# Patient Record
Sex: Female | Born: 1950 | Race: Black or African American | Hispanic: No | Marital: Single | Smoking: Former smoker
Health system: Southern US, Community
[De-identification: ages and names within clinical notes are randomized; demographics above are authoritative.]

## PROBLEM LIST (undated history)

## (undated) DIAGNOSIS — IMO0001 Reserved for inherently not codable concepts without codable children: Secondary | ICD-10-CM

## (undated) DIAGNOSIS — G4730 Sleep apnea, unspecified: Secondary | ICD-10-CM

## (undated) DIAGNOSIS — C9590 Leukemia, unspecified not having achieved remission: Secondary | ICD-10-CM

## (undated) DIAGNOSIS — Z9889 Other specified postprocedural states: Secondary | ICD-10-CM

## (undated) DIAGNOSIS — T4145XA Adverse effect of unspecified anesthetic, initial encounter: Secondary | ICD-10-CM

## (undated) DIAGNOSIS — A809 Acute poliomyelitis, unspecified: Secondary | ICD-10-CM

## (undated) DIAGNOSIS — M792 Neuralgia and neuritis, unspecified: Secondary | ICD-10-CM

## (undated) DIAGNOSIS — R51 Headache: Secondary | ICD-10-CM

## (undated) DIAGNOSIS — Z972 Presence of dental prosthetic device (complete) (partial): Secondary | ICD-10-CM

## (undated) DIAGNOSIS — Z973 Presence of spectacles and contact lenses: Secondary | ICD-10-CM

## (undated) DIAGNOSIS — I2699 Other pulmonary embolism without acute cor pulmonale: Secondary | ICD-10-CM

## (undated) DIAGNOSIS — T8859XA Other complications of anesthesia, initial encounter: Secondary | ICD-10-CM

## (undated) DIAGNOSIS — Z9289 Personal history of other medical treatment: Secondary | ICD-10-CM

## (undated) DIAGNOSIS — R519 Headache, unspecified: Secondary | ICD-10-CM

## (undated) DIAGNOSIS — R32 Unspecified urinary incontinence: Secondary | ICD-10-CM

## (undated) DIAGNOSIS — R112 Nausea with vomiting, unspecified: Secondary | ICD-10-CM

## (undated) DIAGNOSIS — Z86718 Personal history of other venous thrombosis and embolism: Secondary | ICD-10-CM

## (undated) DIAGNOSIS — N3281 Overactive bladder: Secondary | ICD-10-CM

## (undated) DIAGNOSIS — M1990 Unspecified osteoarthritis, unspecified site: Secondary | ICD-10-CM

## (undated) HISTORY — PX: HIP ARTHROSCOPY: SUR88

## (undated) HISTORY — PX: TUMOR REMOVAL: SHX12

## (undated) HISTORY — PX: BACK SURGERY: SHX140

## (undated) HISTORY — PX: ABDOMINAL HYSTERECTOMY: SHX81

## (undated) HISTORY — PX: KNEE ARTHROSCOPY: SUR90

## (undated) HISTORY — PX: COLONOSCOPY W/ POLYPECTOMY: SHX1380

---

## 1982-06-10 DIAGNOSIS — R112 Nausea with vomiting, unspecified: Secondary | ICD-10-CM

## 1982-06-10 DIAGNOSIS — Z9889 Other specified postprocedural states: Secondary | ICD-10-CM

## 1982-06-10 HISTORY — PX: VAGINAL HYSTERECTOMY: SUR661

## 1982-06-10 HISTORY — DX: Other specified postprocedural states: R11.2

## 1982-06-10 HISTORY — DX: Other specified postprocedural states: Z98.890

## 2011-10-05 ENCOUNTER — Emergency Department (INDEPENDENT_AMBULATORY_CARE_PROVIDER_SITE_OTHER): Payer: Worker's Compensation

## 2011-10-05 ENCOUNTER — Encounter (HOSPITAL_BASED_OUTPATIENT_CLINIC_OR_DEPARTMENT_OTHER): Payer: Self-pay | Admitting: *Deleted

## 2011-10-05 ENCOUNTER — Emergency Department (HOSPITAL_BASED_OUTPATIENT_CLINIC_OR_DEPARTMENT_OTHER)
Admission: EM | Admit: 2011-10-05 | Discharge: 2011-10-05 | Disposition: A | Discharge status: Home / Self Care | Payer: Worker's Compensation | Attending: Emergency Medicine | Admitting: Emergency Medicine

## 2011-10-05 DIAGNOSIS — W010XXA Fall on same level from slipping, tripping and stumbling without subsequent striking against object, initial encounter: Secondary | ICD-10-CM | POA: Insufficient documentation

## 2011-10-05 DIAGNOSIS — W19XXXA Unspecified fall, initial encounter: Secondary | ICD-10-CM

## 2011-10-05 DIAGNOSIS — M25569 Pain in unspecified knee: Secondary | ICD-10-CM | POA: Insufficient documentation

## 2011-10-05 DIAGNOSIS — F17200 Nicotine dependence, unspecified, uncomplicated: Secondary | ICD-10-CM | POA: Insufficient documentation

## 2011-10-05 DIAGNOSIS — M25469 Effusion, unspecified knee: Secondary | ICD-10-CM

## 2011-10-05 DIAGNOSIS — M129 Arthropathy, unspecified: Secondary | ICD-10-CM | POA: Insufficient documentation

## 2011-10-05 HISTORY — DX: Unspecified osteoarthritis, unspecified site: M19.90

## 2011-10-05 MED ORDER — HYDROCODONE-ACETAMINOPHEN 5-325 MG PO TABS
1.0000 | ORAL_TABLET | Freq: Once | ORAL | Status: AC
  Administered 2011-10-05: 1 via ORAL
  Filled 2011-10-05: qty 1

## 2011-10-05 MED ORDER — HYDROCODONE-ACETAMINOPHEN 5-500 MG PO TABS: 1.0000 | ORAL_TABLET | Freq: Four times a day (QID) | ORAL | Status: AC | PRN

## 2011-10-05 NOTE — ED Provider Notes (Signed)
History     CSN: [id number]  Arrival date & time 10/05/11  1814   First MD Initiated Contact with Patient 10/05/11 1831      Chief Complaint  Patient presents with  . Knee Pain    (Consider location/radiation/quality/duration/timing/severity/associated sxs/prior treatment) HPI Comments: Pt states that she tripped and fell yesterday and both of her knees hit the tile floor:pt states that initially the right knee was hurting but that has resolved:pt states that she is not only having pain in her left knee which is not resolving with ibuprofen:pt has history of arthroscopic surgery to both  Patient is a 61 y.o. female presenting with knee pain. The history is provided by the patient. No language interpreter was used.  Knee Pain This is a new problem. The current episode started yesterday. The problem occurs constantly. The problem has been unchanged. The symptoms are aggravated by walking and bending. She has tried NSAIDs for the symptoms. The treatment provided mild relief.    Past Medical History  Diagnosis Date  . Arthritis     Past Surgical History  Procedure Date  . Knee surgery   . Abdominal hysterectomy   . Back surgery     History reviewed. No pertinent family history.  History  Substance Use Topics  . Smoking status: Current Some Day Smoker  . Smokeless tobacco: Not on file  . Alcohol Use: No    OB History    Grav Para Term Preterm Abortions TAB SAB Ect Mult Living                  Review of Systems  Constitutional: Negative.   Respiratory: Negative.   Cardiovascular: Negative.   Genitourinary: Negative.   Neurological: Negative.     Allergies  Review of patient's allergies indicates not on file.  Home Medications  No current outpatient prescriptions on file.  BP 164/90  Pulse 90  Temp(Src) 98.4 F (36.9 C) (Oral)  Resp 20  Ht 5' 5.5" (1.664 m)  Wt 345 lb (156.491 kg)  BMI 56.54 kg/m2  SpO2 98%  Physical Exam  Nursing note and vitals  reviewed. Constitutional: She is oriented to person, place, and time. She appears well-developed and well-nourished.  Cardiovascular: Normal rate and regular rhythm.   Pulmonary/Chest: Effort normal and breath sounds normal.  Musculoskeletal:       Pt has generalized tenderness noted to the left knee:pt has full rom:no redness noted  Neurological: She is alert and oriented to person, place, and time.  Skin: Skin is warm.  Psychiatric: She has a normal mood and affect.    ED Course  Procedures (including critical care time)  Labs Reviewed - No data to display Dg Knee Complete 4 Views Left  10/05/2011  *RADIOLOGY REPORT*  Clinical Data: Larey Seat.  Pain.  LEFT KNEE - COMPLETE 4+ VIEW  Comparison: None.  Findings: There is a moderate sized joint effusion.  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  No identifiable acute fracture.  I think there is a small intra-articular loose body.  IMPRESSION: Moderate joint effusion.  Tricompartmental degenerative change most pronounced laterally.  Probable small intra-articular loose body. No acute fracture evident.  Original Report Authenticated By: Thomasenia Sales, M.D.     1. Knee effusion       MDM  Pt placed in knee immobilizer:pt has a walker at home        Teressa Lower, NP 10/05/11 1947

## 2011-10-05 NOTE — ED Provider Notes (Signed)
Medical screening examination/treatment/procedure(s) were performed by non-physician practitioner and as supervising physician I was immediately available for consultation/collaboration.   Celene Kras, MD 10/05/11 [phone]

## 2011-10-05 NOTE — ED Notes (Signed)
Pt stopped at registration upon discharge from facility inquiring about need for UDS.  I went and spoke with patient re: UDS and explained that we would not know which employers require UDS with workers comp cases and that would be up to the individual and the employer to alert ED staff of need for UDS.  Offered to obtain UDS for pt in case she found at later date that claim would require one.  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

## 2011-10-05 NOTE — ED Notes (Signed)
Pt states she slipped yesterday and fell on both knees. Now c/o pain to both. Left worse than right. Some relief with Motrin.

## 2011-10-05 NOTE — Discharge Instructions (Signed)
Knee Effusion  Knee effusion means you have fluid in your knee. The knee may be more difficult to bend and move. HOME CARE  Use crutches or a brace as told by your doctor.   Put ice on the injured area.   Put ice in a plastic bag.   Place a towel between your skin and the bag.   Leave the ice on for 15 to 20 minutes, 3 to 4 times a day.   Raise (elevate) your knee as much as possible.   Only take medicine as told by your doctor.   You may need to do strengthening exercises. Ask your doctor.   Continue with your normal diet and activities as told by your doctor.  GET HELP RIGHT AWAY IF:  You have more puffiness (swelling) in your knee.   You see redness, puffiness, or have more pain in your knee.   You have a temperature by mouth above 102 F (38.9 C).   You get a rash.   You have trouble breathing.   You have a reaction to any medicine you are taking.   You have a lot of pain when you move your knee.  MAKE SURE YOU:  Understand these instructions.   Will watch your condition.   Will get help right away if you are not doing well or get worse.  Document Released: 06/29/2010 Document Revised: 05/16/2011 Document Reviewed: 06/29/2010 ExitCare Patient Information 2012 [employer]. 

## 2012-06-10 DIAGNOSIS — C9590 Leukemia, unspecified not having achieved remission: Secondary | ICD-10-CM

## 2012-06-10 HISTORY — DX: Leukemia, unspecified not having achieved remission: C95.90

## 2012-06-18 ENCOUNTER — Emergency Department (HOSPITAL_BASED_OUTPATIENT_CLINIC_OR_DEPARTMENT_OTHER)
Admission: EM | Admit: 2012-06-18 | Discharge: 2012-06-18 | Disposition: A | Discharge status: Other Acute Inpatient Hospital | Payer: BC Managed Care – PPO | Attending: Emergency Medicine | Admitting: Emergency Medicine

## 2012-06-18 ENCOUNTER — Encounter (HOSPITAL_BASED_OUTPATIENT_CLINIC_OR_DEPARTMENT_OTHER): Payer: Self-pay | Admitting: Emergency Medicine

## 2012-06-18 ENCOUNTER — Emergency Department (HOSPITAL_BASED_OUTPATIENT_CLINIC_OR_DEPARTMENT_OTHER): Payer: BC Managed Care – PPO

## 2012-06-18 DIAGNOSIS — D72829 Elevated white blood cell count, unspecified: Secondary | ICD-10-CM | POA: Insufficient documentation

## 2012-06-18 DIAGNOSIS — Z8612 Personal history of poliomyelitis: Secondary | ICD-10-CM | POA: Insufficient documentation

## 2012-06-18 DIAGNOSIS — Z79899 Other long term (current) drug therapy: Secondary | ICD-10-CM | POA: Insufficient documentation

## 2012-06-18 DIAGNOSIS — R21 Rash and other nonspecific skin eruption: Secondary | ICD-10-CM | POA: Insufficient documentation

## 2012-06-18 DIAGNOSIS — Z8739 Personal history of other diseases of the musculoskeletal system and connective tissue: Secondary | ICD-10-CM | POA: Insufficient documentation

## 2012-06-18 DIAGNOSIS — F17200 Nicotine dependence, unspecified, uncomplicated: Secondary | ICD-10-CM | POA: Insufficient documentation

## 2012-06-18 DIAGNOSIS — C9200 Acute myeloblastic leukemia, not having achieved remission: Secondary | ICD-10-CM | POA: Insufficient documentation

## 2012-06-18 HISTORY — DX: Acute poliomyelitis, unspecified: A80.9

## 2012-06-18 LAB — URINALYSIS, ROUTINE W REFLEX MICROSCOPIC
Bilirubin Urine: NEGATIVE
Glucose, UA: NEGATIVE mg/dL
Nitrite: NEGATIVE
Specific Gravity, Urine: 1.025 (ref 1.005–1.030)
pH: 6.5 (ref 5.0–8.0)

## 2012-06-18 LAB — POCT I-STAT, CHEM 8
Calcium, Ion: 1.15 mmol/L (ref 1.13–1.30)
Glucose, Bld: 98 mg/dL (ref 70–99)
HCT: 35 % — ABNORMAL LOW (ref 36.0–46.0)
Hemoglobin: 11.9 g/dL — ABNORMAL LOW (ref 12.0–15.0)

## 2012-06-18 LAB — CBC WITH DIFFERENTIAL/PLATELET
Basophils Absolute: 0 10*3/uL (ref 0.0–0.1)
Basophils Relative: 0 % (ref 0–1)
Blasts: 60 %
Hemoglobin: 11.7 g/dL — ABNORMAL LOW (ref 12.0–15.0)
MCH: 33.4 pg (ref 26.0–34.0)
MCHC: 34.1 g/dL (ref 30.0–36.0)
Myelocytes: 0 %
Neutro Abs: 0.4 10*3/uL — ABNORMAL LOW (ref 1.7–7.7)
Neutrophils Relative %: 1 % — ABNORMAL LOW (ref 43–77)
Platelets: 22 10*3/uL — CL (ref 150–400)
Promyelocytes Absolute: 0 %
RDW: 14.9 % (ref 11.5–15.5)

## 2012-06-18 LAB — PROTIME-INR
INR: 1.14 (ref 0.00–1.49)
Prothrombin Time: 14.4 seconds (ref 11.6–15.2)

## 2012-06-18 LAB — APTT: aPTT: 33 seconds (ref 24–37)

## 2012-06-18 NOTE — ED Notes (Signed)
[facility] en route and report given via phone.

## 2012-06-18 NOTE — ED Notes (Signed)
Started on Sulfa on 05/22/12 for boil in left axilla.  She began to develop a rash on upper chest, back and bilateral upper arms.  She went to Dr. Linda Hedges office on 06/16/12 to have the rash evaluated.  She received a phone call from Dr. Linda Hedges office yesterday with abnormal lab results.  She was told her WBC's are elevated and her platelets are decreased.  She was supposed to f/u with hematology today, but she began to develop [facility]. She also c/o "pressure behind her eyes" today.

## 2012-06-18 NOTE — ED Notes (Signed)
Meal provided to pt

## 2012-06-18 NOTE — ED Notes (Signed)
Pt. will be transported to [facility], awaiting bed assignment.

## 2012-06-18 NOTE — ED Provider Notes (Signed)
History     CSN: [id number]  Arrival date & time 06/18/12  4098   First MD Initiated Contact with Patient 06/18/12 [phone]      Chief Complaint  Patient presents with  . Shortness of Breath  . Abnormal Lab  . Rash    (Consider location/radiation/quality/duration/timing/severity/associated sxs/prior treatment) HPI Pt with no significant PMH reports she was on a course of Bactrim about a month ago for boil in axilla. About 2 weeks ago she developed a diffuse rash initially on her back and then arms and chest, some itchiness on the back. She was seen in her PCP office 2 days ago for same and had labs done, called yesterday and told she would need hematology followup for leukocytosis and thrombocytopenia. She has had increasing SOB for the last few days as well, but much worse today. Dyspnea with exertion but no CP, cough or fever. She called her PCP office but they had not yet been able to schedule her with Hem/Onc, so they advised her to come to the ED for further eval.   Past Medical History  Diagnosis Date  . Arthritis   . Polio     Past Surgical History  Procedure Date  . Knee surgery   . Abdominal hysterectomy   . Back surgery     No family history on file.  History  Substance Use Topics  . Smoking status: Current Some Day Smoker  . Smokeless tobacco: Not on file  . Alcohol Use: Yes     Comment: rarely    OB History    Grav Para Term Preterm Abortions TAB SAB Ect Mult Living                  Review of Systems All other systems reviewed and are negative except as noted in HPI.   Allergies  Contrast media and Sulfa antibiotics  Home Medications   Current Outpatient Rx  Name  Route  Sig  Dispense  Refill  . DM-GUAIFENESIN ER 30-600 MG PO TB12   Oral   Take 1 tablet by mouth every 12 (twelve) hours. Patient used this medication for chest and head congestion.         . GUAIFENESIN 100 MG/5ML PO SYRP   Oral   Take 200 mg by mouth 3 (three) times daily as  needed. Patient used this medication for her cold symptoms.         . IBUPROFEN 800 MG PO TABS   Oral   Take 800 mg by mouth every 8 (eight) hours as needed. Patient used this medication for arthritis and back pain.         . MOMETASONE FUROATE 50 MCG/ACT NA SUSP   Nasal   Place 2 sprays into the nose daily.         . TOPIRAMATE 50 MG PO TABS   Oral   Take 50 mg by mouth 2 (two) times daily.           BP 139/91  Pulse 97  Resp 20  Ht 5\' 5"  (1.651 m)  Wt 347 lb (157.398 kg)  BMI 57.74 kg/m2  SpO2 95%  Physical Exam  Nursing note and vitals reviewed. Constitutional: She is oriented to person, place, and time. She appears well-developed.       Morbidly obese  HENT:  Head: Normocephalic and atraumatic.  Eyes: EOM are normal. Pupils are equal, round, and reactive to light.  Neck: Normal range of motion. Neck supple.  Cardiovascular:  Normal rate, normal heart sounds and intact distal pulses.   Pulmonary/Chest: Effort normal and breath sounds normal.  Abdominal: Bowel sounds are normal. She exhibits no distension. There is no tenderness.  Musculoskeletal: Normal range of motion. She exhibits no edema and no tenderness.  Neurological: She is alert and oriented to person, place, and time. She has normal strength. No cranial nerve deficit or sensory deficit.  Skin: Skin is warm and dry. Rash (diffuse petechial rash) noted.  Psychiatric: She has a normal mood and affect.    ED Course  Procedures (including critical care time)  Labs Reviewed  CBC WITH DIFFERENTIAL - Abnormal; Notable for the following:    WBC 42.6 (*)  PREV SENT TO [place].Marland KitchenDIFF TO BE PERFORMED AND REPORTED BY [facility].. DR. Bernette Mayers MD NOTIFIED AT 10:20 BY S.ROY ON 06/18/12   RBC 3.50 (*)     Hemoglobin 11.7 (*)     HCT 34.3 (*)     Platelets 22 (*)     All other components within normal limits  POCT I-STAT, CHEM 8 - Abnormal; Notable for the following:    Sodium 146 (*)     Potassium  3.3 (*)     Hemoglobin 11.9 (*)     HCT 35.0 (*)     All other components within normal limits  TROPONIN I  PROTIME-INR  APTT  URINALYSIS, ROUTINE W REFLEX MICROSCOPIC  PATHOLOGIST SMEAR REVIEW   Dg Chest 2 View  06/18/2012  *RADIOLOGY REPORT*  Clinical Data: Lungs of breath  CHEST - 2 VIEW  Comparison: None.  Findings: There is a granuloma in the right upper lobe.  Lungs are otherwise clear.  Heart size and pulmonary vascularity are normal. No adenopathy.  There is degenerative change in the thoracic spine.  IMPRESSION: No edema or consolidation.  Granuloma right upper lobe.   Original Report Authenticated By: Bretta Bang, M.D.      1. AML (acute myeloblastic leukemia)       MDM   Date: 06/18/2012  Rate: 96  Rhythm: normal sinus rhythm  QRS Axis: normal  Intervals: normal  ST/T Wave abnormalities: normal  Conduction Disutrbances: none  Narrative Interpretation: unremarkable  Labs show marked leukocytosis and thrombocytopenia. Slide has been sent to [facility] for pathologist review, however I am concerned about a blast crisis and so I have asked Dr. Myna Hidalgo to review the slide here and he has graciously agreed to do so.   11:11 AM Per Dr Myna Hidalgo he see Auer rods and myeloblasts. He agrees with plan to send to [facility] for further eval. I spoke with Dr. Rennis Harding on call for Hem/Onc at [facility] and she will accept the patient in transfer. Pt informed of diagnosis and plan and is amenable to transfer.      Sofhia Ulibarri B. Bernette Mayers, MD 06/18/12 1419

## 2012-06-18 NOTE — ED Notes (Signed)
Pt. denies fever.  Also reports 3 episodes of diarrhea recently.

## 2012-06-18 NOTE — ED Notes (Signed)
Report called to Nehemiah Settle, Charity fundraiser at [facility].  Pt. Assigned room [phone].

## 2012-06-18 NOTE — ED Notes (Signed)
Pt changed into gown.

## 2012-06-18 NOTE — ED Notes (Signed)
Awaiting [facility] for transport 

## 2012-06-20 LAB — URINE CULTURE: Colony Count: >=100000

## 2012-06-21 NOTE — ED Notes (Signed)
+   Urine Chart sent to EDP office for review. 

## 2012-06-22 ENCOUNTER — Telehealth (HOSPITAL_COMMUNITY): Payer: Self-pay | Admitting: Emergency Medicine

## 2012-06-22 NOTE — ED Notes (Signed)
Chart returned from EDP office. Prescribed Cipro 500 mg tabs. #10. One tab PO BID x 5 days. Prescribed by Jaynie Crumble PA-C.

## 2012-07-28 ENCOUNTER — Telehealth: Payer: Self-pay | Admitting: Hematology & Oncology

## 2012-07-28 NOTE — Telephone Encounter (Signed)
Noreene Larsson from [facility] called [phone] said pt needs labs starting 2-24 and that there MD had spoke with Dr. Myna Hidalgo. Noreene Larsson is aware Dr. Myna Hidalgo wants to review chart before making appointment and is faxing records to the RN fax number. Amy RN aware

## 2012-07-29 ENCOUNTER — Telehealth: Payer: Self-pay | Admitting: Hematology & Oncology

## 2012-07-29 NOTE — Telephone Encounter (Signed)
Pt aware of 2-24 appointment, we have received the lab orders Amy RN has a copy

## 2012-07-29 NOTE — Telephone Encounter (Signed)
Left message for pt to call with details for 2-24 appointment. Left message with Noreene Larsson at [facility] [phone] that we received chart from them but no lab orders. I asked for her to fax the order to [place].

## 2012-08-03 ENCOUNTER — Ambulatory Visit: Payer: Self-pay | Admitting: Medical

## 2012-08-03 ENCOUNTER — Other Ambulatory Visit: Payer: Self-pay | Admitting: Lab

## 2012-08-03 ENCOUNTER — Ambulatory Visit: Payer: Self-pay

## 2012-08-07 ENCOUNTER — Ambulatory Visit (HOSPITAL_BASED_OUTPATIENT_CLINIC_OR_DEPARTMENT_OTHER): Payer: BC Managed Care – PPO | Admitting: Hematology & Oncology

## 2012-08-07 ENCOUNTER — Other Ambulatory Visit: Payer: Self-pay | Admitting: Medical Oncology

## 2012-08-07 ENCOUNTER — Ambulatory Visit: Payer: BC Managed Care – PPO | Admitting: Lab

## 2012-08-07 ENCOUNTER — Ambulatory Visit (HOSPITAL_BASED_OUTPATIENT_CLINIC_OR_DEPARTMENT_OTHER): Payer: BC Managed Care – PPO

## 2012-08-07 ENCOUNTER — Encounter (HOSPITAL_COMMUNITY)
Admission: RE | Admit: 2012-08-07 | Discharge: 2012-08-07 | Disposition: A | Discharge status: Home / Self Care | Payer: BC Managed Care – PPO | Source: Ambulatory Visit | Attending: Hematology & Oncology | Admitting: Hematology & Oncology

## 2012-08-07 ENCOUNTER — Observation Stay (HOSPITAL_COMMUNITY): Admission: AD | Admit: 2012-08-07 | Payer: Self-pay | Source: Ambulatory Visit | Admitting: Hematology & Oncology

## 2012-08-07 VITALS — BP 125/59 | HR 96 | Temp 98.6°F | Resp 18 | Ht 64.0 in | Wt 338.0 lb

## 2012-08-07 DIAGNOSIS — C9201 Acute myeloblastic leukemia, in remission: Secondary | ICD-10-CM

## 2012-08-07 DIAGNOSIS — D696 Thrombocytopenia, unspecified: Secondary | ICD-10-CM

## 2012-08-07 DIAGNOSIS — C9200 Acute myeloblastic leukemia, not having achieved remission: Secondary | ICD-10-CM

## 2012-08-07 LAB — CBC WITH DIFFERENTIAL (CANCER CENTER ONLY)
BASO#: 0 10*3/uL (ref 0.0–0.2)
BASO%: 0 % (ref 0.0–2.0)
EOS%: 0 % (ref 0.0–7.0)
HGB: 9.5 g/dL — ABNORMAL LOW (ref 11.6–15.9)
MCH: 32.2 pg (ref 26.0–34.0)
MCHC: 33.7 g/dL (ref 32.0–36.0)
MONO%: 1.6 % (ref 0.0–13.0)
NEUT#: 0 10*3/uL — CL (ref 1.5–6.5)
RDW: 15.2 % (ref 11.1–15.7)

## 2012-08-07 LAB — ABO/RH
ABO/RH(D): B POS
ABO/RH(D): B POS

## 2012-08-07 LAB — COMPREHENSIVE METABOLIC PANEL
ALT: 14 U/L (ref 0–35)
AST: 10 U/L (ref 0–37)
Albumin: 3.8 g/dL (ref 3.5–5.2)
Alkaline Phosphatase: 91 U/L (ref 39–117)
BUN: 20 mg/dL (ref 6–23)
Creatinine, Ser: 0.77 mg/dL (ref 0.50–1.10)
Potassium: 4.2 mEq/L (ref 3.5–5.3)

## 2012-08-07 NOTE — Progress Notes (Signed)
This office note has been dictated.

## 2012-08-08 ENCOUNTER — Other Ambulatory Visit: Payer: Self-pay | Admitting: *Deleted

## 2012-08-08 ENCOUNTER — Ambulatory Visit (HOSPITAL_BASED_OUTPATIENT_CLINIC_OR_DEPARTMENT_OTHER): Payer: BC Managed Care – PPO

## 2012-08-08 ENCOUNTER — Telehealth: Payer: Self-pay | Admitting: *Deleted

## 2012-08-08 VITALS — BP 126/78 | HR 81 | Temp 97.4°F | Resp 18

## 2012-08-08 DIAGNOSIS — C9200 Acute myeloblastic leukemia, not having achieved remission: Secondary | ICD-10-CM

## 2012-08-08 DIAGNOSIS — D696 Thrombocytopenia, unspecified: Secondary | ICD-10-CM

## 2012-08-08 MED ORDER — HEPARIN SOD (PORK) LOCK FLUSH 100 UNIT/ML IV SOLN
500.0000 [IU] | Freq: Every day | INTRAVENOUS | Status: AC | PRN
  Administered 2012-08-08: 500 [IU]
  Filled 2012-08-08: qty 5

## 2012-08-08 MED ORDER — SODIUM CHLORIDE 0.9 % IJ SOLN
10.0000 mL | INTRAMUSCULAR | Status: AC | PRN
  Administered 2012-08-08: 10 mL
  Filled 2012-08-08: qty 10

## 2012-08-08 MED ORDER — DIPHENHYDRAMINE HCL 25 MG PO CAPS
25.0000 mg | ORAL_CAPSULE | Freq: Once | ORAL | Status: AC
  Administered 2012-08-08: 25 mg via ORAL

## 2012-08-08 MED ORDER — SODIUM CHLORIDE 0.9 % IV SOLN
250.0000 mL | Freq: Once | INTRAVENOUS | Status: AC
  Administered 2012-08-08: 250 mL via INTRAVENOUS

## 2012-08-08 MED ORDER — ACETAMINOPHEN 325 MG PO TABS
650.0000 mg | ORAL_TABLET | Freq: Once | ORAL | Status: AC
  Administered 2012-08-08: 650 mg via ORAL

## 2012-08-08 NOTE — Progress Notes (Signed)
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  HISTORY OF PRESENT ILLNESS:  Ana Jones is a very nice 62 year old white female.  She is originally from [place], [place].  She has an apartment in [place].  She initially presented to the [facility] emergency room back in January.  At that point in time, she I think had some leukocytosis.  She had a blood smear reviewed by myself actually that day.  She had blasts.  She then referred to [facility].  She underwent evaluation.  A bone marrow biopsy was done which showed acute myelomonocytic leukemia with eosinophils.  She had chromosomes done which showed inverted chromosome 16.  This is an excellent prognosis leukemia.  She was found to have 50% blasts.  She underwent induction chemotherapy with ara-C/daunorubicin.  She tolerated this well.  She subsequently was found to be in remission on repeat bone marrow.  Cytogenetics were negative.  She then was treated with consolidation chemotherapy with high-dose ara- C.  She received 2 g per sq m.  She was given her last I think chemotherapy I think a week or so ago.  She was brought to the [facility] so we could monitor her blood counts.  She feels okay.  She noticed, this morning, that she was having some "blood blister" on her skin.  This is usually a sign that her blood counts were low.  She has had no fever.  Her appetite has been okay.  She has had no nausea or vomiting.  There has been no diarrhea.  She has had no headache.  There has been no double vision or blurred vision.  Again, we were asked to see her so we could help with blood count management in-between her admissions to [facility].  PAST MEDICAL HISTORY:  Remarkable for: 1. Arthritis. 2. Polio.  ALLERGIES: 1. IV dye. 2. Sulfa antibiotics.  MEDICATIONS: 1. Acyclovir 400 mg p.o. b.i.d. 2. Diflucan 200 mg p.o. daily x7 days. 3.  Clindamycin gel 1% apply to affected area. 4. Levaquin 500 mg p.o. daily. 5. Nasonex nasal spray 2 sprays in the nose daily. 6. Zofran 8 mg p.o. q.8 hours x7 days. 7. _____10 mg p.o. q.6 hours p.r.n. 8. Topamax 50 mg p.o. b.i.d.  SOCIAL HISTORY:  Remarkable for tobacco use.  She probably has about a 20 pack year tobacco use.  She has rare alcohol use.  There are no obvious occupational exposures.  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  FAMILY HISTORY:  Relatively noncontributory.  REVIEW OF SYSTEMS:  As stated in history of present illness.  No additional findings noted on a 12 system review.  PHYSICAL EXAMINATION:  General:  This is an obese African American female in no obvious distress.  Vital signs:  Show temperature of 98.6. Pulse 96, respiratory rate 18, blood pressure 125/59.  Weight is 338. Head and neck:  Show a normocephalic, atraumatic skull.  There are no ocular or oral lesions.  There are no palpable cervical or supraclavicular lymph nodes.  Lungs:  Clear bilaterally.  She has some slight decrease at the bases.  Cardiac:  Regular rate and rhythm with normal S1 and S2.  There are no murmurs, rubs or bruits.  Abdomen: Soft, mildly obese.  She has decreased bowel sounds.  There is no fluid wave.  There is no palpable hepatosplenomegaly.  Extremities:  Show some minimal nonpitting edema in her lower legs.  She has good  range of motion of her joints.  Neurological:  Shows no focal neurological deficits.  Skin:  Does show some areas of petechia on her left upper arm, anterior chest.  LABORATORY STUDIES:  White cell count is 11.3, hemoglobin 9.5, hematocrit 28.2, platelet count less than 6.  IMPRESSION:  Ana Jones is a real nice 62 year old African American female with acute myelomonocytic leukemia.  She has an excellent  prognostic leukemia given that she has inverted chromosome 16.  According to the old FAB classification, she would be classified as  M4 with eosinophilia.  I suspect that she likely will be cured.  It looks like she got into remission nicely with 1 cycle of treatment.  She will need to have platelets.  We will need to get this set up for tomorrow.  They will need to be radiated.  She will have blood work set up twice a week.  We will make sure that this is done on Monday and Thursday and do it in the morning so that we can follow her that day and be able to transfuse her that day if necessary.  It was nice to see [name] and that she has done so well.  She says that she goes back I think March 31 for her next cycle of consolidation therapy.  Again, we would be more than happy to see her at any time.  She will have her lab work done twice a week and if needed we can see her while she is here.    ______________________________ Josph Macho, M.D. PRE/MEDQ  D:  08/07/2012  T:  08/08/2012  Job:  [id number]

## 2012-08-08 NOTE — Progress Notes (Signed)
Received faxed report of port placement from 07/27/12 from [place].  Results to be scanned.   Reinforced neutropenic precautions with pt and family.  Gave pt a few masks and instructed her to wear when out in public area.  Both pt and family voiced understanding.

## 2012-08-08 NOTE — Telephone Encounter (Signed)
Pt here for platelets. Call to [facility]  [phone] for records confirming port placement. Pt port placed on 07/27/12.

## 2012-08-08 NOTE — Patient Instructions (Addendum)
Platelet Transfusion Information This is information about transfusions of platelets. Platelets are tiny cells made by the bone marrow and found in the blood. When a blood vessel is damaged platelets rush to the damaged area to help form a clot. This begins the healing process. When platelets get very low your blood may have trouble clotting. This may be from:  Illness.  Blood disorder.  Chemotherapy to treat cancer. Often lower platelet counts do not usually cause problems.  Platelets usually last for 7 to 10 days. If they are not used not used in an injury, they are broken down by the liver or spleen. Symptoms of low platelet count include:  Nosebleeds.  Bleeding gums.  Heavy periods.  Bruising and tiny blood spots in the skin.  Pin point spots of bleeding are called (petechiae).  Larger bruises (purpura).  Bleeding can be more serious if it happens in the brain or bowel. Platelet transfusions are often used to keep the platelet count at an acceptable level. Serious bleeding due to low platelets is uncommon. RISKS AND COMPLICATIONS Severe side effects from platelet transfusions are uncommon. Minor reactions may include:  Itching.  Rashes.  High temperature and shivering. Medications are available to stop transfusion reactions. Let your caregivers know if you develop any of the above problems.  If you are having platelet transfusions frequently they may get less effective. This is called becoming refractory to platelets. It is uncommon. This can happen from non-immune causes and immune causes. Non-immune causes include:  High temperatures.  Some medications.  An enlarged spleen. Immune causes happen when your body discovers the platelets are not your own and begin making antibodies against them. The antibodies kill the platelets quickly. Even with platelet transfusions you may still notice problems with bleeding or bruising. Let your caregivers know about this. Other things  can be done to help if this happens.  BEFORE THE PROCEDURE   Your doctors will check your platelet count regularly.  If the platelet count is too low it may be necessary to have a platelet transfusion.  This is more important before certain procedures with a risk of bleeding such as a spinal tap.  Platelet transfusion reduces the risk of bleeding during or after the procedure.  Except in emergencies, giving a transfusion requires a written consent. Before blood is taken from a donor, a complete history is taken to make sure the person has no history of previous diseases, nor engages in risky social behavior. Examples of this are intravenous drug use or sexual activity with multiple partners. This could lead to infected blood or blood products being used. This history is done even in spite of the extensive testing to make sure the blood is safe. All blood products transfused are tested to make sure it is a match for the person getting the blood. It is also checked for infections. Blood is the safest it has ever been. The risk of getting an infection is very low. PROCEDURE  The platelets are stored in small plastic bags which are kept at a low temperature.  Each bag is called a unit and sometimes two units are given. They are given through an intravenous line by drip infusion over about one half hour.  Usually blood is collected from multiple people to get enough to transfuse.  Sometimes, the platelets are collected from a single person. This is done using a special machine that separates the platelets from the blood. The machine is called an apheresis machine. Platelets collected   in this way are called apheresed platelets. Apheresed platelets reduce the risk of becoming sensitive to the platelets. This lowers the chances of having a transfusion reaction.  As it only takes a short time to give the platelets, this treatment can be given in an outpatients department. Platelets can also be given  before or after other treatments. SEEK IMMEDIATE MEDICAL CARE IF: Any of the following symptoms over the next 12 hours or several days:  Shaking chills.  Fever with a temperature greater than 102 F (38.9 C) develops.  Back pain or muscle pain.  People around you feel you are not acting correctly, or you are confused.  Blood in the urine or bowel movements or bleeding from any place in your body.  Shortness of breath, or difficulty breathing.  Dizziness.  Fainting.  You break out in a rash or develop hives.  You have a decrease in the amount of urine you are putting out, or the urine turns a dark color or changes to pink, red, or brown.  A severe headache or stiff neck.  Bruising more easily. Document Released: 03/24/2007 Document Revised: 08/19/2011 Document Reviewed: 03/24/2007 ExitCare Patient Information 2013 [employer].  

## 2012-08-09 LAB — PREPARE PLATELET PHERESIS: Unit division: 0

## 2012-08-10 ENCOUNTER — Other Ambulatory Visit (HOSPITAL_BASED_OUTPATIENT_CLINIC_OR_DEPARTMENT_OTHER): Payer: BC Managed Care – PPO | Admitting: Lab

## 2012-08-10 ENCOUNTER — Encounter (HOSPITAL_COMMUNITY)
Admission: RE | Admit: 2012-08-10 | Discharge: 2012-08-10 | Disposition: A | Discharge status: Home / Self Care | Payer: BC Managed Care – PPO | Source: Ambulatory Visit | Attending: Hematology & Oncology | Admitting: Hematology & Oncology

## 2012-08-10 ENCOUNTER — Other Ambulatory Visit: Payer: Self-pay | Admitting: Lab

## 2012-08-10 ENCOUNTER — Other Ambulatory Visit: Payer: Self-pay | Admitting: Medical

## 2012-08-10 DIAGNOSIS — C9200 Acute myeloblastic leukemia, not having achieved remission: Secondary | ICD-10-CM

## 2012-08-10 LAB — CBC WITH DIFFERENTIAL (CANCER CENTER ONLY)
BASO#: 0 10*3/uL (ref 0.0–0.2)
BASO%: 0 % (ref 0.0–2.0)
Eosinophils Absolute: 0 10*3/uL (ref 0.0–0.5)
HCT: 25.1 % — ABNORMAL LOW (ref 34.8–46.6)
HGB: 8.4 g/dL — ABNORMAL LOW (ref 11.6–15.9)
LYMPH#: 1 10*3/uL (ref 0.9–3.3)
LYMPH%: 98.1 % — ABNORMAL HIGH (ref 14.0–48.0)
MCV: 95 fL (ref 81–101)
MONO#: 0 10*3/uL — ABNORMAL LOW (ref 0.1–0.9)
NEUT%: 0 % — ABNORMAL LOW (ref 39.6–80.0)
RDW: 14.9 % (ref 11.1–15.7)
WBC: 1 10*3/uL — ABNORMAL LOW (ref 3.9–10.0)

## 2012-08-10 LAB — COMPREHENSIVE METABOLIC PANEL
ALT: 11 U/L (ref 0–35)
BUN: 19 mg/dL (ref 6–23)
CO2: 22 mEq/L (ref 19–32)
Calcium: 9.1 mg/dL (ref 8.4–10.5)
Chloride: 108 mEq/L (ref 96–112)
Creatinine, Ser: 0.76 mg/dL (ref 0.50–1.10)
Glucose, Bld: 132 mg/dL — ABNORMAL HIGH (ref 70–99)
Total Bilirubin: 0.6 mg/dL (ref 0.3–1.2)

## 2012-08-10 LAB — MAGNESIUM: Magnesium: 1.9 mg/dL (ref 1.5–2.5)

## 2012-08-10 LAB — HOLD TUBE, BLOOD BANK - CHCC SATELLITE

## 2012-08-11 ENCOUNTER — Ambulatory Visit (HOSPITAL_BASED_OUTPATIENT_CLINIC_OR_DEPARTMENT_OTHER): Payer: BC Managed Care – PPO

## 2012-08-11 VITALS — BP 134/86 | HR 89 | Temp 97.5°F | Resp 24

## 2012-08-11 DIAGNOSIS — D649 Anemia, unspecified: Secondary | ICD-10-CM

## 2012-08-11 DIAGNOSIS — C9200 Acute myeloblastic leukemia, not having achieved remission: Secondary | ICD-10-CM

## 2012-08-11 MED ORDER — DIPHENHYDRAMINE HCL 25 MG PO CAPS
25.0000 mg | ORAL_CAPSULE | Freq: Once | ORAL | Status: AC
  Administered 2012-08-11: 25 mg via ORAL

## 2012-08-11 MED ORDER — ACETAMINOPHEN 325 MG PO TABS
650.0000 mg | ORAL_TABLET | Freq: Once | ORAL | Status: AC
  Administered 2012-08-11: 650 mg via ORAL

## 2012-08-11 MED ORDER — FUROSEMIDE 10 MG/ML IJ SOLN
20.0000 mg | Freq: Once | INTRAMUSCULAR | Status: AC
  Administered 2012-08-11: 15:00:00 via INTRAVENOUS

## 2012-08-11 MED ORDER — ACETAMINOPHEN 325 MG PO TABS: 650.0000 mg | ORAL_TABLET | Freq: Once | ORAL | Status: DC

## 2012-08-11 MED ORDER — SODIUM CHLORIDE 0.9 % IJ SOLN
10.0000 mL | INTRAMUSCULAR | Status: AC | PRN
  Administered 2012-08-11: 10 mL
  Filled 2012-08-11: qty 10

## 2012-08-11 MED ORDER — HEPARIN SOD (PORK) LOCK FLUSH 100 UNIT/ML IV SOLN
500.0000 [IU] | Freq: Every day | INTRAVENOUS | Status: AC | PRN
  Administered 2012-08-11: 500 [IU]
  Filled 2012-08-11: qty 5

## 2012-08-11 MED ORDER — SODIUM CHLORIDE 0.9 % IV SOLN
250.0000 mL | Freq: Once | INTRAVENOUS | Status: AC
  Administered 2012-08-11: 250 mL via INTRAVENOUS

## 2012-08-11 NOTE — Patient Instructions (Signed)
Blood Transfusion Information WHAT IS A BLOOD TRANSFUSION? A transfusion is the replacement of blood or some of its parts. Blood is made up of multiple cells which provide different functions.  Red blood cells carry oxygen and are used for blood loss replacement.  White blood cells fight against infection.  Platelets control bleeding.  Plasma helps clot blood.  Other blood products are available for specialized needs, such as hemophilia or other clotting disorders. BEFORE THE TRANSFUSION  Who gives blood for transfusions?   You may be able to donate blood to be used at a later date on yourself (autologous donation).  Relatives can be asked to donate blood. This is generally not any safer than if you have received blood from a stranger. The same precautions are taken to ensure safety when a relative's blood is donated.  Healthy volunteers who are fully evaluated to make sure their blood is safe. This is blood bank blood. Transfusion therapy is the safest it has ever been in the practice of medicine. Before blood is taken from a donor, a complete history is taken to make sure that person has no history of diseases nor engages in risky social behavior (examples are intravenous drug use or sexual activity with multiple partners). The donor's travel history is screened to minimize risk of transmitting infections, such as malaria. The donated blood is tested for signs of infectious diseases, such as HIV and hepatitis. The blood is then tested to be sure it is compatible with you in order to minimize the chance of a transfusion reaction. If you or a relative donates blood, this is often done in anticipation of surgery and is not appropriate for emergency situations. It takes many days to process the donated blood. RISKS AND COMPLICATIONS Although transfusion therapy is very safe and saves many lives, the main dangers of transfusion include:   Getting an infectious disease.  Developing a  transfusion reaction. This is an allergic reaction to something in the blood you were given. Every precaution is taken to prevent this. The decision to have a blood transfusion has been considered carefully by your caregiver before blood is given. Blood is not given unless the benefits outweigh the risks. AFTER THE TRANSFUSION  Right after receiving a blood transfusion, you will usually feel much better and more energetic. This is especially true if your red blood cells have gotten low (anemic). The transfusion raises the level of the red blood cells which carry oxygen, and this usually causes an energy increase.  The nurse administering the transfusion will monitor you carefully for complications. HOME CARE INSTRUCTIONS  No special instructions are needed after a transfusion. You may find your energy is better. Speak with your caregiver about any limitations on activity for underlying diseases you may have. SEEK MEDICAL CARE IF:   Your condition is not improving after your transfusion.  You develop redness or irritation at the intravenous (IV) site. SEEK IMMEDIATE MEDICAL CARE IF:  Any of the following symptoms occur over the next 12 hours:  Shaking chills.  You have a temperature by mouth above 102 F (38.9 C), not controlled by medicine.  Chest, back, or muscle pain.  People around you feel you are not acting correctly or are confused.  Shortness of breath or difficulty breathing.  Dizziness and fainting.  You get a rash or develop hives.  You have a decrease in urine output.  Your urine turns a dark color or changes to pink, red, or brown. Any of the following   symptoms occur over the next 10 days:  You have a temperature by mouth above 102 F (38.9 C), not controlled by medicine.  Shortness of breath.  Weakness after normal activity.  The white part of the eye turns yellow (jaundice).  You have a decrease in the amount of urine or are urinating less often.  Your  urine turns a dark color or changes to pink, red, or brown. Document Released: 05/24/2000 Document Revised: 08/19/2011 Document Reviewed: 01/11/2008 Exit[employer] Patient Information 2013 [employer].  

## 2012-08-12 LAB — PREPARE PLATELET PHERESIS: Unit division: 0

## 2012-08-12 LAB — TYPE AND SCREEN
ABO/RH(D): B POS
Antibody Screen: NEGATIVE
Unit division: 0

## 2012-08-13 ENCOUNTER — Other Ambulatory Visit: Payer: Self-pay | Admitting: Lab

## 2012-08-17 ENCOUNTER — Other Ambulatory Visit: Payer: Self-pay | Admitting: Lab

## 2012-08-19 ENCOUNTER — Encounter: Payer: Self-pay | Admitting: *Deleted

## 2012-08-20 ENCOUNTER — Other Ambulatory Visit: Payer: Self-pay | Admitting: Lab

## 2012-08-20 ENCOUNTER — Telehealth: Payer: Self-pay | Admitting: Hematology & Oncology

## 2012-08-20 NOTE — Telephone Encounter (Signed)
Left message for pt to call and reschedule missed 3-13 appointment and that 3-17 is moved to 3-18

## 2012-08-24 ENCOUNTER — Other Ambulatory Visit: Payer: Self-pay | Admitting: Lab

## 2012-08-25 ENCOUNTER — Other Ambulatory Visit: Payer: Self-pay | Admitting: Lab

## 2012-08-26 ENCOUNTER — Encounter: Payer: Self-pay | Admitting: Hematology & Oncology

## 2012-08-27 ENCOUNTER — Other Ambulatory Visit: Payer: Self-pay | Admitting: Lab

## 2012-08-31 ENCOUNTER — Other Ambulatory Visit: Payer: Self-pay | Admitting: Lab

## 2012-09-01 ENCOUNTER — Encounter: Payer: Self-pay | Admitting: Hematology & Oncology

## 2012-09-03 ENCOUNTER — Other Ambulatory Visit: Payer: Self-pay | Admitting: Lab

## 2012-09-07 ENCOUNTER — Other Ambulatory Visit: Payer: Self-pay | Admitting: Lab

## 2012-09-14 ENCOUNTER — Emergency Department (HOSPITAL_COMMUNITY): Payer: BC Managed Care – PPO

## 2012-09-14 ENCOUNTER — Emergency Department (HOSPITAL_COMMUNITY)
Admission: EM | Admit: 2012-09-14 | Discharge: 2012-09-14 | Disposition: A | Discharge status: Home / Self Care | Payer: BC Managed Care – PPO | Attending: Emergency Medicine | Admitting: Emergency Medicine

## 2012-09-14 ENCOUNTER — Other Ambulatory Visit: Payer: Self-pay

## 2012-09-14 ENCOUNTER — Encounter (HOSPITAL_COMMUNITY): Payer: Self-pay | Admitting: *Deleted

## 2012-09-14 DIAGNOSIS — Z8739 Personal history of other diseases of the musculoskeletal system and connective tissue: Secondary | ICD-10-CM | POA: Insufficient documentation

## 2012-09-14 DIAGNOSIS — Z79899 Other long term (current) drug therapy: Secondary | ICD-10-CM | POA: Insufficient documentation

## 2012-09-14 DIAGNOSIS — F17200 Nicotine dependence, unspecified, uncomplicated: Secondary | ICD-10-CM | POA: Insufficient documentation

## 2012-09-14 DIAGNOSIS — R5381 Other malaise: Secondary | ICD-10-CM | POA: Insufficient documentation

## 2012-09-14 DIAGNOSIS — N390 Urinary tract infection, site not specified: Secondary | ICD-10-CM

## 2012-09-14 DIAGNOSIS — Z8619 Personal history of other infectious and parasitic diseases: Secondary | ICD-10-CM | POA: Insufficient documentation

## 2012-09-14 DIAGNOSIS — E860 Dehydration: Secondary | ICD-10-CM

## 2012-09-14 LAB — POCT I-STAT, CHEM 8
Calcium, Ion: 1.14 mmol/L (ref 1.13–1.30)
Chloride: 105 mEq/L (ref 96–112)
Glucose, Bld: 119 mg/dL — ABNORMAL HIGH (ref 70–99)
HCT: 36 % (ref 36.0–46.0)
TCO2: 25 mmol/L (ref 0–100)

## 2012-09-14 LAB — CBC WITH DIFFERENTIAL/PLATELET
Basophils Absolute: 0 10*3/uL (ref 0.0–0.1)
Basophils Relative: 0 % (ref 0–1)
Eosinophils Absolute: 0.6 10*3/uL (ref 0.0–0.7)
Eosinophils Relative: 4 % (ref 0–5)
HCT: 37.1 % (ref 36.0–46.0)
MCHC: 33.4 g/dL (ref 30.0–36.0)
MCV: 91.4 fL (ref 78.0–100.0)
Monocytes Absolute: 1.1 10*3/uL — ABNORMAL HIGH (ref 0.1–1.0)
Platelets: 142 10*3/uL — ABNORMAL LOW (ref 150–400)
RDW: 18.4 % — ABNORMAL HIGH (ref 11.5–15.5)

## 2012-09-14 LAB — URINALYSIS, ROUTINE W REFLEX MICROSCOPIC
Bilirubin Urine: NEGATIVE
Ketones, ur: NEGATIVE mg/dL
Nitrite: NEGATIVE
Protein, ur: NEGATIVE mg/dL
Urobilinogen, UA: 0.2 mg/dL (ref 0.0–1.0)
pH: 5.5 (ref 5.0–8.0)

## 2012-09-14 LAB — URINE MICROSCOPIC-ADD ON

## 2012-09-14 MED ORDER — LEVOFLOXACIN 500 MG PO TABS: 500.0000 mg | ORAL_TABLET | Freq: Every day | ORAL | Status: DC

## 2012-09-14 MED ORDER — LEVOFLOXACIN 500 MG PO TABS
500.0000 mg | ORAL_TABLET | Freq: Once | ORAL | Status: AC
  Administered 2012-09-14: 500 mg via ORAL
  Filled 2012-09-14: qty 1

## 2012-09-14 MED ORDER — SODIUM CHLORIDE 0.9 % IV BOLUS (SEPSIS)
1000.0000 mL | Freq: Once | INTRAVENOUS | Status: AC
  Administered 2012-09-14: 1000 mL via INTRAVENOUS

## 2012-09-14 MED ORDER — POLYETHYLENE GLYCOL 3350 17 G PO PACK: 17.0000 g | PACK | Freq: Two times a day (BID) | ORAL | Status: DC

## 2012-09-14 MED ORDER — LEVOFLOXACIN 500 MG PO TABS: 500.0000 mg | ORAL_TABLET | Freq: Once | ORAL | Status: DC

## 2012-09-14 MED ORDER — SODIUM CHLORIDE 0.9 % IV SOLN: INTRAVENOUS | Status: DC

## 2012-09-14 NOTE — ED Notes (Signed)
Pt arrived from [facility] c/o irregular heart rate 170 and greater. Denies N/V, CP, diaphoresis

## 2012-09-14 NOTE — ED Provider Notes (Signed)
History     CSN: [id number]  Arrival date & time 09/14/12  0023   First MD Initiated Contact with Patient 09/14/12 0037      Chief Complaint  Patient presents with  . Irregular Heart Beat    (Consider location/radiation/quality/duration/timing/severity/associated sxs/prior treatment) HPI Comments: Ana Jones is a 62 y.o. Female who is in rehabilitation, for weakness, and began to have periods of tachycardia today. These periods occur without provocation and are accompanied with a sensation of pressure in her chest. She denies nausea, vomiting, or diaphoresis. She has been constipated, and has not had a bowel movement in 5 days. She is not using anything for constipation. She does not have chest pain unless she is having tachycardia. She denies cough or shortness of breath. She does not have leg pain. She has weakness that prevents her from walking, that she feels is from a recent "coma". She was discharged from the hospital a few weeks ago, to rehabilitation, for "muscle atrophy". There are no known modifying factors. The patient reports that her urinary catheter was removed yesterday and, today she's been spotting, voiding without difficulty.  The history is provided by the patient.    Past Medical History  Diagnosis Date  . Arthritis   . Polio     Past Surgical History  Procedure Laterality Date  . Knee surgery    . Abdominal hysterectomy    . Back surgery      No family history on file.  History  Substance Use Topics  . Smoking status: Current Some Day Smoker  . Smokeless tobacco: Not on file  . Alcohol Use: Yes     Comment: rarely    OB History   Grav Para Term Preterm Abortions TAB SAB Ect Mult Living                  Review of Systems  All other systems reviewed and are negative.    Allergies  Contrast media; Sulfamethoxazole; and Sulfa antibiotics  Home Medications   Current Outpatient Rx  Name  Route  Sig  Dispense  Refill  . acyclovir (ZOVIRAX)  400 MG tablet      400 mg. Take 1 tablet (400 mg total) by mouth 2 times daily.         . clindamycin (CLINDAGEL) 1 % gel      Apply thin layer to affected area twice daily.         . diphenhydrAMINE (BENADRYL) 25 MG tablet   Oral   Take 25 mg by mouth every 6 (six) hours as needed for itching.         . docusate sodium (COLACE) 100 MG capsule   Oral   Take 100 mg by mouth daily as needed for constipation.         . fluconazole (DIFLUCAN) 200 MG tablet   Oral   Take 200 mg by mouth daily.         . hydrocortisone 2.5 % cream      Apply thin layer to affected area three times daily as needed for itching.         . mometasone (NASONEX) 50 MCG/ACT nasal spray   Nasal   Place 2 sprays into the nose daily.         Marland Kitchen oxycodone (OXY-IR) 5 MG capsule   Oral   Take 5-10 mg by mouth every 4 (four) hours as needed for pain (5 mg for mild pain, 10 mg for moderate to  severe pain).         . prochlorperazine (COMPAZINE) 10 MG tablet      10 mg. Take 1 tablet (10 mg total) by mouth every 6 (six) hours as needed.         . topiramate (TOPAMAX) 50 MG tablet   Oral   Take 50 mg by mouth daily.          Marland Kitchen zolpidem (AMBIEN) 5 MG tablet   Oral   Take 5 mg by mouth at bedtime as needed for sleep.         Marland Kitchen levofloxacin (LEVAQUIN) 500 MG tablet   Oral   Take 1 tablet (500 mg total) by mouth daily.   10 tablet   0     BP 108/72  Pulse 117  Temp(Src) 98.1 F (36.7 C) (Oral)  Resp 20  SpO2 97%  Physical Exam  Nursing note and vitals reviewed. Constitutional: She is oriented to person, place, and time. She appears well-developed.  Obese  HENT:  Head: Normocephalic and atraumatic.  Eyes: Conjunctivae and EOM are normal. Pupils are equal, round, and reactive to light.  Neck: Normal range of motion and phonation normal. Neck supple.  Cardiovascular: Regular rhythm and intact distal pulses.   Heart rate 115 to 120 during physical exam.  Pulmonary/Chest:  Effort normal and breath sounds normal. She exhibits no tenderness.  Abdominal: Soft. She exhibits no distension. There is no tenderness. There is no guarding.  Musculoskeletal: Normal range of motion.  Neurological: She is alert and oriented to person, place, and time. She has normal strength. She exhibits normal muscle tone.  Skin: Skin is warm and dry.  Psychiatric: She has a normal mood and affect. Her behavior is normal. Judgment and thought content normal.    ED Course  Procedures (including critical care time)  Medications  sodium chloride 0.9 % bolus 1,000 mL (1,000 mLs Intravenous New Bag/Given 09/14/12 0135)       Date: 03/27/2012  Rate: 118  Rhythm: sinus tachycardia  QRS Axis: left  PR and QT Intervals: normal  ST/T Wave abnormalities: normal  PR and QRS Conduction Disutrbances:none  Narrative Interpretation:   Old EKG Reviewed: changes noted- QRS axis changed  3:04 AM Reevaluation with update and discussion. After initial assessment and treatment, an updated evaluation reveals she is comfortable and expresses no further complaints. Patient and her family member are apprised of the findings. Mackenzy Eisenberg L    Labs Reviewed  CBC WITH DIFFERENTIAL - Abnormal; Notable for the following:    WBC 13.1 (*)    RDW 18.4 (*)    Platelets 142 (*)    Neutro Abs 9.2 (*)    Monocytes Absolute 1.1 (*)    All other components within normal limits  URINALYSIS, ROUTINE W REFLEX MICROSCOPIC - Abnormal; Notable for the following:    APPearance CLOUDY (*)    Leukocytes, UA SMALL (*)    All other components within normal limits  URINE MICROSCOPIC-ADD ON - Abnormal; Notable for the following:    Squamous Epithelial / LPF FEW (*)    Bacteria, UA FEW (*)    All other components within normal limits  POCT I-STAT, CHEM 8 - Abnormal; Notable for the following:    Potassium 3.4 (*)    Creatinine, Ser 1.50 (*)    Glucose, Bld 119 (*)    All other components within normal limits  URINE  CULTURE  POCT I-STAT TROPONIN I   Dg Chest [facility] 1 [address]  09/14/2012  *RADIOLOGY REPORT*  Clinical Data: Tachycardia.  The  PORTABLE CHEST - 1 VIEW  Comparison: 06/18/2012.  Findings: The heart size is exaggerated by low lung volumes.  Mild pulmonary vascular congestion is evident.  No focal airspace disease is present.  A granuloma in the right upper lobe is stable. The visualized soft tissues and bony thorax are unremarkable.  IMPRESSION:  1.  Low lung volumes. 2.  Mild pulmonary vascular congestion.   Original Report Authenticated By: Marin Roberts, M.D.    Nursing Notes Reviewed/ Care Coordinated Applicable Imaging Reviewed Interpretation of Laboratory Data incorporated into ED treatment  1. UTI (lower urinary tract infection)   2. Dehydration       MDM  Tachycardia, controlled in the ED. No further episodes of tachycardia. Evaluation is remarkable for mild dehydration evidenced by increased creatinine. The patient is voiding well today by her report. I doubt urinary outflow obstruction. Urinalysis is consistent with UTI. Urine culture has been ordered. The patient is tolerating oral fluids in the ED, and is stable for discharge.    Plan: Home Medications- Levaquin; Home Treatments- increase oral fluids; Recommended follow up- PCP prn    Flint Melter, MD 09/14/12 [phone]

## 2012-09-15 LAB — URINE CULTURE

## 2012-09-16 ENCOUNTER — Telehealth (HOSPITAL_COMMUNITY): Payer: Self-pay | Admitting: Emergency Medicine

## 2012-09-16 NOTE — ED Notes (Signed)
+  Urine. Patient treated with Levaquin. Sensitive to same. Per protocol MD. °

## 2012-09-16 NOTE — ED Notes (Signed)
Patient has +Urine culture. °

## 2012-09-19 ENCOUNTER — Encounter: Payer: Self-pay | Admitting: Adult Health

## 2012-09-21 ENCOUNTER — Encounter: Payer: Self-pay | Admitting: Adult Health

## 2013-05-01 NOTE — Progress Notes (Signed)
This encounter was created in error - please disregard.

## 2014-07-19 ENCOUNTER — Ambulatory Visit: Payer: BC Managed Care – PPO | Admitting: Podiatry

## 2014-08-09 HISTORY — PX: KNEE SURGERY: SHX244

## 2015-12-09 DIAGNOSIS — IMO0001 Reserved for inherently not codable concepts without codable children: Secondary | ICD-10-CM

## 2015-12-09 DIAGNOSIS — I2699 Other pulmonary embolism without acute cor pulmonale: Secondary | ICD-10-CM

## 2015-12-09 DIAGNOSIS — Z86718 Personal history of other venous thrombosis and embolism: Secondary | ICD-10-CM

## 2015-12-09 HISTORY — DX: Personal history of other venous thrombosis and embolism: Z86.718

## 2015-12-09 HISTORY — DX: Reserved for inherently not codable concepts without codable children: IMO0001

## 2015-12-09 HISTORY — DX: Other pulmonary embolism without acute cor pulmonale: I26.99

## 2015-12-14 ENCOUNTER — Emergency Department (HOSPITAL_BASED_OUTPATIENT_CLINIC_OR_DEPARTMENT_OTHER): Payer: Medicare Other

## 2015-12-14 ENCOUNTER — Inpatient Hospital Stay (HOSPITAL_COMMUNITY): Payer: Medicare Other

## 2015-12-14 ENCOUNTER — Emergency Department (HOSPITAL_COMMUNITY)
Admit: 2015-12-14 | Discharge: 2015-12-14 | Disposition: A | Discharge status: Home / Self Care | Payer: Medicare Other | Attending: Emergency Medicine | Admitting: Emergency Medicine

## 2015-12-14 ENCOUNTER — Inpatient Hospital Stay (HOSPITAL_BASED_OUTPATIENT_CLINIC_OR_DEPARTMENT_OTHER)
Admission: EM | Admit: 2015-12-14 | Discharge: 2015-12-16 | DRG: 176 | Disposition: A | Discharge status: Home / Self Care | Payer: Medicare Other | Attending: Internal Medicine | Admitting: Internal Medicine

## 2015-12-14 ENCOUNTER — Encounter (HOSPITAL_BASED_OUTPATIENT_CLINIC_OR_DEPARTMENT_OTHER): Payer: Self-pay | Admitting: Emergency Medicine

## 2015-12-14 ENCOUNTER — Observation Stay (HOSPITAL_COMMUNITY): Payer: Medicare Other

## 2015-12-14 DIAGNOSIS — Z8249 Family history of ischemic heart disease and other diseases of the circulatory system: Secondary | ICD-10-CM

## 2015-12-14 DIAGNOSIS — E669 Obesity, unspecified: Secondary | ICD-10-CM | POA: Diagnosis present

## 2015-12-14 DIAGNOSIS — I82431 Acute embolism and thrombosis of right popliteal vein: Secondary | ICD-10-CM | POA: Diagnosis present

## 2015-12-14 DIAGNOSIS — I82439 Acute embolism and thrombosis of unspecified popliteal vein: Secondary | ICD-10-CM | POA: Diagnosis present

## 2015-12-14 DIAGNOSIS — M79609 Pain in unspecified limb: Secondary | ICD-10-CM | POA: Diagnosis not present

## 2015-12-14 DIAGNOSIS — R0609 Other forms of dyspnea: Secondary | ICD-10-CM | POA: Diagnosis not present

## 2015-12-14 DIAGNOSIS — Z6841 Body Mass Index (BMI) 40.0 and over, adult: Secondary | ICD-10-CM

## 2015-12-14 DIAGNOSIS — R000 Tachycardia, unspecified: Secondary | ICD-10-CM | POA: Diagnosis present

## 2015-12-14 DIAGNOSIS — R0600 Dyspnea, unspecified: Secondary | ICD-10-CM | POA: Diagnosis present

## 2015-12-14 DIAGNOSIS — R0602 Shortness of breath: Secondary | ICD-10-CM

## 2015-12-14 DIAGNOSIS — Z823 Family history of stroke: Secondary | ICD-10-CM

## 2015-12-14 DIAGNOSIS — Z91041 Radiographic dye allergy status: Secondary | ICD-10-CM

## 2015-12-14 DIAGNOSIS — Z79899 Other long term (current) drug therapy: Secondary | ICD-10-CM

## 2015-12-14 DIAGNOSIS — Z882 Allergy status to sulfonamides status: Secondary | ICD-10-CM

## 2015-12-14 DIAGNOSIS — Z86718 Personal history of other venous thrombosis and embolism: Secondary | ICD-10-CM

## 2015-12-14 DIAGNOSIS — R9431 Abnormal electrocardiogram [ECG] [EKG]: Secondary | ICD-10-CM | POA: Diagnosis present

## 2015-12-14 DIAGNOSIS — M1990 Unspecified osteoarthritis, unspecified site: Secondary | ICD-10-CM | POA: Diagnosis present

## 2015-12-14 DIAGNOSIS — I2699 Other pulmonary embolism without acute cor pulmonale: Secondary | ICD-10-CM | POA: Diagnosis present

## 2015-12-14 DIAGNOSIS — G14 Postpolio syndrome: Secondary | ICD-10-CM | POA: Diagnosis present

## 2015-12-14 DIAGNOSIS — Z856 Personal history of leukemia: Secondary | ICD-10-CM

## 2015-12-14 DIAGNOSIS — G4733 Obstructive sleep apnea (adult) (pediatric): Secondary | ICD-10-CM | POA: Diagnosis present

## 2015-12-14 DIAGNOSIS — Z87891 Personal history of nicotine dependence: Secondary | ICD-10-CM

## 2015-12-14 DIAGNOSIS — C9251 Acute myelomonocytic leukemia, in remission: Secondary | ICD-10-CM | POA: Diagnosis present

## 2015-12-14 DIAGNOSIS — Z9221 Personal history of antineoplastic chemotherapy: Secondary | ICD-10-CM

## 2015-12-14 DIAGNOSIS — Z820 Family history of epilepsy and other diseases of the nervous system: Secondary | ICD-10-CM

## 2015-12-14 DIAGNOSIS — R7989 Other specified abnormal findings of blood chemistry: Secondary | ICD-10-CM

## 2015-12-14 HISTORY — DX: Reserved for inherently not codable concepts without codable children: IMO0001

## 2015-12-14 HISTORY — DX: Leukemia, unspecified not having achieved remission: C95.90

## 2015-12-14 HISTORY — DX: Sleep apnea, unspecified: G47.30

## 2015-12-14 HISTORY — DX: Personal history of other venous thrombosis and embolism: Z86.718

## 2015-12-14 HISTORY — DX: Overactive bladder: N32.81

## 2015-12-14 LAB — ECHOCARDIOGRAM COMPLETE
CHL CUP TV REG PEAK VELOCITY: 284 cm/s
FS: 28 % (ref 28–44)
Height: 65 in
IVS/LV PW RATIO, ED: 0.93
LA vol A4C: 33.2 ml
LADIAMINDEX: 1.36 cm/m2
LASIZE: 39 mm
LAVOL: 36.3 mL
LAVOLIN: 12.7 mL/m2
LDCA: 2.84 cm2
LEFT ATRIUM END SYS DIAM: 39 mm
LV PW d: 10.9 mm — AB (ref 0.6–1.1)
LV TDI E'MEDIAL: 7.07
LV e' LATERAL: 9.68 cm/s
LVOT diameter: 19 mm
Lateral S' vel: 10.7 cm/s
RV sys press: 37 mmHg
TAPSE: 14.1 mm
TDI e' lateral: 9.68
TRMAXVEL: 284 cm/s
Weight: 5840 oz

## 2015-12-14 LAB — CBC WITH DIFFERENTIAL/PLATELET
BASOS PCT: 1 %
Basophils Absolute: 0 10*3/uL (ref 0.0–0.1)
EOS PCT: 2 %
Eosinophils Absolute: 0.2 10*3/uL (ref 0.0–0.7)
HEMATOCRIT: 44.9 % (ref 36.0–46.0)
Hemoglobin: 14.9 g/dL (ref 12.0–15.0)
LYMPHS PCT: 33 %
Lymphs Abs: 2.7 10*3/uL (ref 0.7–4.0)
MCH: 33.3 pg (ref 26.0–34.0)
MCHC: 33.2 g/dL (ref 30.0–36.0)
MCV: 100.4 fL — AB (ref 78.0–100.0)
MONO ABS: 0.7 10*3/uL (ref 0.1–1.0)
MONOS PCT: 9 %
NEUTROS ABS: 4.7 10*3/uL (ref 1.7–7.7)
Neutrophils Relative %: 55 %
PLATELETS: 127 10*3/uL — AB (ref 150–400)
RBC: 4.47 MIL/uL (ref 3.87–5.11)
RDW: 13.9 % (ref 11.5–15.5)
WBC: 8.3 10*3/uL (ref 4.0–10.5)

## 2015-12-14 LAB — CBC AND DIFFERENTIAL
HCT: 45 % (ref 36–46)
Hemoglobin: 14.9 g/dL (ref 12.0–16.0)
Platelets: 127 10*3/uL — AB (ref 150–399)
WBC: 8.3 10^3/mL

## 2015-12-14 LAB — URINALYSIS, ROUTINE W REFLEX MICROSCOPIC
BILIRUBIN URINE: NEGATIVE
Glucose, UA: NEGATIVE mg/dL
Hgb urine dipstick: NEGATIVE
KETONES UR: NEGATIVE mg/dL
NITRITE: NEGATIVE
PH: 6 (ref 5.0–8.0)
Protein, ur: NEGATIVE mg/dL
Specific Gravity, Urine: 1.023 (ref 1.005–1.030)

## 2015-12-14 LAB — URINE MICROSCOPIC-ADD ON: RBC / HPF: NONE SEEN RBC/hpf (ref 0–5)

## 2015-12-14 LAB — BASIC METABOLIC PANEL
Anion gap: 8 (ref 5–15)
BUN: 20 mg/dL (ref 4–21)
BUN: 20 mg/dL (ref 6–20)
CALCIUM: 8.7 mg/dL — AB (ref 8.9–10.3)
CO2: 20 mmol/L — AB (ref 22–32)
CREATININE: 1.12 mg/dL — AB (ref 0.44–1.00)
CREATININE: 1.1 mg/dL (ref 0.5–1.1)
Chloride: 112 mmol/L — ABNORMAL HIGH (ref 101–111)
GFR calc Af Amer: 59 mL/min — ABNORMAL LOW (ref >60)
GFR calc non Af Amer: 51 mL/min — ABNORMAL LOW (ref >60)
GLUCOSE: 105 mg/dL — AB (ref 65–99)
Glucose: 105 mg/dL
Potassium: 4.3 mmol/L (ref 3.4–5.3)
Potassium: 4.3 mmol/L (ref 3.5–5.1)
Sodium: 140 mmol/L (ref 135–145)
Sodium: 140 mmol/L (ref 137–147)

## 2015-12-14 LAB — TROPONIN I: Troponin I: 0.04 ng/mL (ref <0.03)

## 2015-12-14 LAB — BRAIN NATRIURETIC PEPTIDE: B Natriuretic Peptide: 528.4 pg/mL — ABNORMAL HIGH (ref 0.0–100.0)

## 2015-12-14 LAB — D-DIMER, QUANTITATIVE: D-Dimer, Quant: 6.24 ug/mL-FEU — ABNORMAL HIGH (ref 0.00–0.50)

## 2015-12-14 MED ORDER — ACETAMINOPHEN 325 MG PO TABS
650.0000 mg | ORAL_TABLET | Freq: Four times a day (QID) | ORAL | Status: DC | PRN
Start: 2015-12-14 — End: 2015-12-16
  Administered 2015-12-14 – 2015-12-15 (×3): 650 mg via ORAL
  Filled 2015-12-14 (×3): qty 2

## 2015-12-14 MED ORDER — ACETAMINOPHEN 650 MG RE SUPP: 650.0000 mg | Freq: Four times a day (QID) | RECTAL | Status: DC | PRN

## 2015-12-14 MED ORDER — TOPIRAMATE 25 MG PO TABS
50.0000 mg | ORAL_TABLET | Freq: Every day | ORAL | Status: DC
  Administered 2015-12-14 – 2015-12-15 (×2): 50 mg via ORAL
  Filled 2015-12-14 (×2): qty 2

## 2015-12-14 MED ORDER — ENOXAPARIN SODIUM 150 MG/ML ~~LOC~~ SOLN
1.0000 mg/kg | Freq: Two times a day (BID) | SUBCUTANEOUS | Status: DC
Start: 2015-12-14 — End: 2015-12-16
  Administered 2015-12-14 – 2015-12-16 (×5): 165 mg via SUBCUTANEOUS
  Filled 2015-12-14 (×6): qty 1.1

## 2015-12-14 MED ORDER — CYCLOBENZAPRINE HCL 10 MG PO TABS
10.0000 mg | ORAL_TABLET | Freq: Three times a day (TID) | ORAL | Status: DC
  Administered 2015-12-14 – 2015-12-16 (×7): 10 mg via ORAL
  Filled 2015-12-14 (×7): qty 1

## 2015-12-14 MED ORDER — SODIUM CHLORIDE 0.9% FLUSH
3.0000 mL | Freq: Two times a day (BID) | INTRAVENOUS | Status: DC
  Administered 2015-12-14 – 2015-12-16 (×4): 3 mL via INTRAVENOUS

## 2015-12-14 MED ORDER — TECHNETIUM TC 99M DIETHYLENETRIAME-PENTAACETIC ACID: 31.0000 | Freq: Once | INTRAVENOUS | Status: DC | PRN

## 2015-12-14 MED ORDER — ENOXAPARIN SODIUM 150 MG/ML ~~LOC~~ SOLN
1.0000 mg/kg | Freq: Once | SUBCUTANEOUS | Status: AC
  Administered 2015-12-14: 165 mg via SUBCUTANEOUS
  Filled 2015-12-14: qty 2

## 2015-12-14 MED ORDER — TECHNETIUM TO 99M ALBUMIN AGGREGATED
4.0000 | Freq: Once | INTRAVENOUS | Status: AC | PRN
  Administered 2015-12-14: 4 via INTRAVENOUS

## 2015-12-14 NOTE — ED Notes (Signed)
Attempted report 

## 2015-12-14 NOTE — Progress Notes (Addendum)
Preliminary results by tech - Bilateral Lower Ext. Venous Duplex Completed. Right leg is positive for sub-acute deep vein thrombosis involving the popliteal vein. The peroneal veins were not clearly visualized. Left leg, negative for deep vein thrombosis. Results given to patient's nurse, Hildred Alamin. Oda Cogan, BS, RDMS, RVT

## 2015-12-14 NOTE — ED Notes (Signed)
Pt transported to vascular.  °

## 2015-12-14 NOTE — H&P (Signed)
Date: 12/14/2015               Patient Name:  Ana Jones MRN: [id number]  DOB: 05-Jul-1950 Age / Sex: 65 y.o., female   PCP: Laurel Dimmer, MD         Medical Service: Internal Medicine Teaching Service         Attending Physician: Dr. Bartholomew Crews, MD    First Contact: Dr. Philipp Ovens Pager: [phone]  Second Contact: Dr. Randell Patient Pager: [phone]       After Hours (After 5p/  First Contact Pager: [phone]  weekends / holidays): Second Contact Pager: [phone]   Chief Complaint: Shortness of Breath  History of Present Illness: Ana Jones is a 65 yo F with PMHx of acute myelomonocytic leukemia diagnosed 06/2012 and in remission, obesity, h/o RUE DVT in 2014 and OSA who presented to [facility] on 7/5 with complaint of shortness of breath.   Patient states that 10 days ago she developed increased fatigue, followed by worsening DOE 3 days later, her DOE progressed until it was unmanageable. She cannot walk more than a few steps without having to stop to rest as she will become lightheaded and dizzy. It is very difficult for her to use the restroom or do any type of activity. She does not have any chest pain or shortness of breath at rest. She admits to a history of DOE, but it is manageable and she is able to do her normal daily activities and attend social functions. She has never had shortness of breath like this before. Her shortness of breath came on gradually 10 days ago and was not preceded by any illness. As mentioned, she denies chest pain or pain with inspiration but does admit to a soreness under her right scapula for 3 months. She has a history of leukemia treated 3 years ago and has had normal follow ups ever since. She had right hip surgery 2 months ago. She denies recent immobilization. She traveled to [facility] via car for her son's wedding 5 days ago, but her symptoms preceded this trip. She denies cough or hemoptysis. She denies estrogen use. She admits to left leg  cramps in her calf and her knee. She admits to a personal history of RUE DVT in 2014 after her treatment for leukemia. She has a significant family history for PE in her brother and embolic CVA in her other brother.   Patient denies any known personal history of cardiac disease. She admits to a 15 pound weight gain in the last 2 months which she attributes to steroid use for her hip pain. She denies orthopnea- she sleeps on 3 pillows given back pain from the weight of her breasts, not for shortness of breath. She admits to increased feet swelling just in the last few days after her car ride. She denies wheezing, cough.   In the ED, patient was afebrile, normotensive at 103/81, tachycardic to 116, RR 21 and Pulse Ox 93% on room air. Labs revealed normal WBC at 8.3, Hgb 14.9, platelets at baseline of 127, creatinine of 1.12, and bicarb of 20. The rest of the BMET was WNL. EKG showed sinus tachycardia, with left axis deviation, similar to prior from 2014. CXR showed an enlarged cardiac silhouette, although slightly rotated and low lung volumes, no hampton's hump or other acute cardiopulmonary processes. BNP elevated at 528.4 (no priors). Given concern for a PE, a d-dimer was checked which was very elevated at 6.24. Unfortunately,  patient is allergic to contrast media, so she was given a therapeutic dose of Lovenox at 0418 this morning and transferred to [facility] for a V/Q scan and LE dopplers. Preliminary read on Bilateral Lower Ext. Venous Duplex showed Right leg is positive for chronic appearing deep vein thrombosis involving the popliteal vein. The peroneal veins were not clearly visualized. Left leg, negative for deep vein thrombosis. Results given to patient's nurse.Given on going shortness of breath, patient was called for admission for further work up.  Meds: Current Facility-Administered Medications  Medication Dose Route Frequency Provider Last Rate Last Dose  . technetium TC 14M  diethylenetriame-pentaacetic acid (DTPA) injection 31 milli Curie  31 milli Curie Intravenous Once PRN Gus Height, MD       Current Outpatient Prescriptions  Medication Sig Dispense Refill  . cyclobenzaprine (FLEXERIL) 10 MG tablet Take 10 mg by mouth 3 (three) times daily as needed for muscle spasms.    Marland Kitchen tolterodine (DETROL LA) 2 MG 24 hr capsule Take 2 mg by mouth daily.    Marland Kitchen topiramate (TOPAMAX) 50 MG tablet Take 50 mg by mouth 3 (three) times daily.    . traMADol (ULTRAM) 50 MG tablet Take 50 mg by mouth every 6 (six) hours as needed for moderate pain.       Allergies: Allergies as of 12/14/2015 - Review Complete 12/14/2015  Allergen Reaction Noted  . Contrast media [iodinated diagnostic agents] Anaphylaxis 06/18/2012  . Sulfamethoxazole Itching and Swelling 08/07/2012  . Sulfa antibiotics Rash 06/18/2012   Past Medical History  Diagnosis Date  . Arthritis   . Polio   . Leukemia ([place])     Family History:  Mother: heart disease, Alzheimer's Father: Kidney Disease Brother 1: PE Brother 2: Embolic CVA  Social History:  Tobacco Use: 40 pack year history, quit in 2013 Alcohol Use: Rare Illicit Drug Use: None  Review of Systems: A complete ROS was negative except as per HPI.  General: Admits to fatigue and weight gain. Denies fever, chills, change in appetite and diaphoresis.  Respiratory: Admits to SOB, DOE. Denies hemoptysis, cough, chest tightness, and wheezing.   Cardiovascular: Denies chest pain, orthopnea and palpitations.  Gastrointestinal: Admits to loose stools. Denies nausea, vomiting, abdominal pain, diarrhea, constipation Genitourinary: Denies dysuria, urgency, frequency. Endocrine: Denies polyuria, and polydipsia. Musculoskeletal: Admits to left lower extremity cramps in calf and neck pain. Denies back pain. Skin: Denies rash and wounds.  Neurological: Admits to dizziness, headaches, lightheadedness after exertion. Denies weakness. Psychiatric/Behavioral:  Denies mood changes, confusion.  Physical Exam: Filed Vitals:   12/14/15 0615 12/14/15 0700 12/14/15 0730 12/14/15 0800  BP: 114/79 127/96 126/93 109/85  Pulse: 109 103 103 108  Temp:      TempSrc:      Resp: 25 17 22 16   Height:      Weight:      SpO2: 96% 97% 98% 97%   General: Vital signs reviewed.  Patient is obese, in no acute distress and cooperative with exam.  Head: Normocephalic and atraumatic. Eyes: EOMI, conjunctivae normal, no scleral icterus.  Neck: Supple, trachea midline, mild + JVD (difficult to assess due to obesity), no carotid bruit present.  Cardiovascular: Tachycardic, regular rhythm, S1 normal, S2 normal, no murmurs, gallops, or rubs. Pulmonary/Chest: Clear to auscultation bilaterally, no wheezes, rales, or rhonchi. Abdominal: Soft, non-tender, non-distended, BS +, no guarding present.  Extremities: Trace lower extremity edema bilaterally to ankles,  pulses symmetric and intact bilaterally. No cyanosis or clubbing. Neurological: Awake, alert, oriented. Skin: Warm,  dry and intact. No rashes or erythema. Psychiatric: Normal mood and affect. speech and behavior is normal. Cognition and memory are normal.   EKG: EKG showed sinus tachycardia, with left axis deviation, similar to prior from 2014.   CXR: CXR showed an enlarged cardiac silhouette, although slightly rotated and low lung volumes, no hampton's hump or other acute cardiopulmonary processes.  Assessment & Plan by Problem: Active Problems:   Dyspnea  Ana Jones is a 65 yo F with PMHx of acute myelomonocytic leukemia diagnosed 06/2012 and in remission, obesity, h/o RUE DVT in 2014 who presented with complaint of DOE for 10 days.   DOE: Patient presents with 10 day history of progressive DOE and tachycardia. CXR showed no acute cardiopulmonary process. EKG is without ischemic changes, but shows sinus tachycardia. D-dimer elevated at 6.24. Bilateral Lower Ext. Venous Duplex showed right leg is positive for  sub-acute deep vein thrombosis involving the popliteal vein although her pain is in the left lower extremity. Patient does have a history of RUE DVT in 2014 s/p Lovenox therapy and a family history of blood clots. Well's PE criteria is high risk at 9 for tachycardia, DVT, h/o DVT, and likely diagnosis. This may have occurred given her recent surgery, although it occurred 1.5 months ago. Patient's V/Q scan is pending. Leading diagnosis is PE, although the timing is odd for her presentation. Other differential diagnoses included CHF exacerbation given elevated BNP and cardiomegaly; however, her lungs are clear, only mild trace edema to ankles and mild JVD. CXR did not show evidence of vascular congestion or edema. I doubt COPD or an acute respiratory illness based on symptoms and CXR findings. Her progressive dyspnea could be due to obesity hypoventilation and deconditioning given her recent weight gain and history of DOE. Progressive coronary ischemia could cause worsening DOE; however, patient denies any history of heart disease or symptoms of chest pain. For now, we will proceed with V/Q scan, admit to telemetry and anticoagulate. -Admit to telemetry -V/Q scan -Lovenox per pharmacy (given weight- would not do new anticoagulants such as Eliquis or Xarelto) -Repeat EKG tomorrow am -CBC/BMET tomorrow am -Echocardiogram for systolic function and assess for RV strain -Check TSH given fatigue, weight gain  History of Acute Myelomonocytic Leukemia: Diagnosed and treated in 2014. Went into remission after 1 cycle of chemotherapy with Daunorubicin and Ara-C. Follow ups have been normal.  -In remission  DVT/PE ppx: Therapeutic Lovenox CODE: FULL FEN: HH  Dispo: Admit patient to Observation with expected length of stay less than 2 midnights.  Signed: Martyn Malay, DO PGY-3 Internal Medicine Resident Pager # [phone] 12/14/2015 10:29 AM

## 2015-12-14 NOTE — ED Notes (Signed)
MD at bedside. 

## 2015-12-14 NOTE — Progress Notes (Signed)
  Echocardiogram 2D Echocardiogram has been performed.  Ana Jones 12/14/2015, 2:33 PM

## 2015-12-14 NOTE — ED Provider Notes (Signed)
evalutaion ongoing at this time However due to persistent tachycardia/tachypnea with minimal movement, I feel admission is warranted, pt agreeable D/w internal medicine teaching service for unassigned (her PCP is in [facility])   Ripley Fraise, MD 12/14/15 [phone]

## 2015-12-14 NOTE — ED Notes (Signed)
Pt transported to NM 

## 2015-12-14 NOTE — ED Provider Notes (Signed)
Patient transferred from [place] for further evaluation. Patient was seen with 1 week history of shortness of breath. Patient reports that she is comfortable sitting but if she gets up and walks she has severe dyspnea with exertion. Patient just completed 8 hour car trip. She does have a history of DVT 2 years ago. She was transferred to have further evaluation for possible DVT and PE. She has a contrast dye allergy, could not get CT scan performed. She was transferred for VQ scan and venous duplex.  EKG shows sinus tachycardia. No obvious ischemia. BNP is elevated, suspicious for possible congestive heart failure as a cause for dyspnea. Will perform above-mentioned studies, if negative will likely need further workup for cardiac etiology.  Orpah Greek, MD 12/14/15 [phone]

## 2015-12-14 NOTE — ED Notes (Signed)
NM to transport pt for testing prior to transport to floor.

## 2015-12-14 NOTE — ED Notes (Signed)
Per MD, pt to be NPO-pt aware and verbalized understanding.

## 2015-12-14 NOTE — ED Notes (Addendum)
Pt states before the ShOB stated she has been traveling in the car. Pt speaking in full sentences. NAD. Pt states the ShOB is worse with exertion. Pt also states that she has had leg cramps and neck pain x 3-4 days.

## 2015-12-14 NOTE — ED Notes (Signed)
NS verified Vascular aware of study order.

## 2015-12-14 NOTE — ED Provider Notes (Addendum)
CSN: [id number]     Arrival date & time 12/14/15  0107 History   First MD Initiated Contact with Patient 12/14/15 0208     Chief Complaint  Patient presents with  . Shortness of Breath     (Consider location/radiation/quality/duration/timing/severity/associated sxs/prior Treatment) HPI  This is a 65 year old female with a history of leukemia, obesity, polio and upper extremity DVT. 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  She is here with a one-week history of shortness of breath. She feels comfortable at rest but gets very short of breath on exertion, even walking a few steps. The shortness of breath began before she traveled to [place] for a wedding. She returned yesterday. There has been associated lightheadedness at times. She denies any chest pain or cough. She denies nausea or vomiting but has had looser stools than usual. She is having some edema in her lower legs which is chronic. She has also been having several days of cramping in her left posterior thigh which she attributes to potassium loss.  Past Medical History  Diagnosis Date  . Arthritis   . Polio   . Leukemia [facility])    Past Surgical History  Procedure Laterality Date  . Knee surgery    . Abdominal hysterectomy    . Back surgery     History reviewed. No pertinent family history. Social History  Substance Use Topics  . Smoking status: Former Research scientist (life sciences)  . Smokeless tobacco: None  . Alcohol Use: Yes     Comment: rarely   OB History    No data available     Review of Systems  All other systems reviewed and are negative.   Allergies  Contrast media; Sulfamethoxazole; and Sulfa antibiotics  Home Medications   Prior to Admission medications   Medication Sig Start Date End Date Taking? Authorizing Provider  cyclobenzaprine (FLEXERIL) 10 MG tablet Take 10 mg by mouth 3 (three) times daily as needed for muscle spasms.   Yes Historical Provider, MD  tolterodine (DETROL LA) 2 MG 24 hr  capsule Take 2 mg by mouth daily.   Yes Historical Provider, MD  traMADol (ULTRAM) 50 MG tablet Take by mouth every 6 (six) hours as needed.   Yes Historical Provider, MD  oxycodone (OXY-IR) 5 MG capsule Take 5-10 mg by mouth every 4 (four) hours as needed for pain (5 mg for mild pain, 10 mg for moderate to severe pain).    Historical Provider, MD   BP 118/94 mmHg  Pulse 109  Temp(Src) 97.5 F (36.4 C) (Oral)  Resp 24  Ht 5\' 5"  (1.651 m)  Wt 365 lb (165.563 kg)  BMI 60.74 kg/m2  SpO2 98%   Physical Exam General: Well-developed, morbidly obese female in no acute distress; appearance consistent with age of record HENT: normocephalic; atraumatic Eyes: pupils equal, round and reactive to light; extraocular muscles intact Neck: supple Heart: regular rate and rhythm Lungs: clear to auscultation bilaterally Abdomen: soft; obese; nontender; bowel sounds present Extremities: No deformity; full range of motion; pulses normal; trace edema of lower legs Neurologic: Awake, alert and oriented; paresis of left lower extremity with altered sensation; no facial droop Skin: Warm and dry Psychiatric: Normal mood and affect    ED Course  Procedures (including critical care time)   EKG Interpretation   Date/Time:  Thursday December 14 2015 01:25:41 EDT Ventricular Rate:  115 PR Interval:    QRS Duration: 90 QT Interval:  339 QTC Calculation: 469 R Axis:   -  78 Text Interpretation:  Sinus tachycardia No significant change was found  Confirmed by Fotios Amos  MD, Jenny Reichmann ([id number]) on 12/14/2015 1:51:30 AM      MDM  Nursing notes and vitals signs, including pulse oximetry, reviewed.  Summary of this visit's results, reviewed by myself:  Labs:  Results for orders placed or performed during the hospital encounter of 12/14/15 (from the past 24 hour(s))  CBC with Differential/Platelet     Status: Abnormal   Collection Time: 12/14/15  2:54 AM  Result Value Ref Range   WBC 8.3 4.0 - 10.5 K/uL   RBC 4.47  3.87 - 5.11 MIL/uL   Hemoglobin 14.9 12.0 - 15.0 g/dL   HCT 44.9 36.0 - 46.0 %   MCV 100.4 (H) 78.0 - 100.0 fL   MCH 33.3 26.0 - 34.0 pg   MCHC 33.2 30.0 - 36.0 g/dL   RDW 13.9 11.5 - 15.5 %   Platelets 127 (L) 150 - 400 K/uL   Neutrophils Relative % 55 %   Neutro Abs 4.7 1.7 - 7.7 K/uL   Lymphocytes Relative 33 %   Lymphs Abs 2.7 0.7 - 4.0 K/uL   Monocytes Relative 9 %   Monocytes Absolute 0.7 0.1 - 1.0 K/uL   Eosinophils Relative 2 %   Eosinophils Absolute 0.2 0.0 - 0.7 K/uL   Basophils Relative 1 %   Basophils Absolute 0.0 0.0 - 0.1 K/uL  Basic metabolic panel     Status: Abnormal   Collection Time: 12/14/15  2:54 AM  Result Value Ref Range   Sodium 140 135 - 145 mmol/L   Potassium 4.3 3.5 - 5.1 mmol/L   Chloride 112 (H) 101 - 111 mmol/L   CO2 20 (L) 22 - 32 mmol/L   Glucose, Bld 105 (H) 65 - 99 mg/dL   BUN 20 6 - 20 mg/dL   Creatinine, Ser 1.12 (H) 0.44 - 1.00 mg/dL   Calcium 8.7 (L) 8.9 - 10.3 mg/dL   GFR calc non Af Amer 51 (L) >60 mL/min   GFR calc Af Amer 59 (L) >60 mL/min   Anion gap 8 5 - 15  Troponin I     Status: Abnormal   Collection Time: 12/14/15  2:54 AM  Result Value Ref Range   Troponin I 0.04 (HH) <0.03 ng/mL  Brain natriuretic peptide     Status: Abnormal   Collection Time: 12/14/15  2:54 AM  Result Value Ref Range   B Natriuretic Peptide 528.4 (H) 0.0 - 100.0 pg/mL  D-dimer, quantitative (not at [facility])     Status: Abnormal   Collection Time: 12/14/15  2:54 AM  Result Value Ref Range   D-Dimer, Quant 6.24 (H) 0.00 - 0.50 ug/mL-FEU    Imaging Studies: Dg Chest 2 View  12/14/2015  CLINICAL DATA:  Shortness of breath.  Right-sided chest pain. EXAM: CHEST  2 VIEW COMPARISON:  08/03/2014 FINDINGS: Lung volumes are low. The heart is enlarged, may be accentuated by technique. Mediastinal contours are unchanged. Calcified granuloma in the right upper lung. No consolidation to suggest pneumonia. No large pleural effusion or pneumothorax. No pulmonary edema.  Detailed evaluation limited by soft tissue attenuation from body habitus. IMPRESSION: Enlargement of the cardiac silhouette, may be accentuated by low lung volumes. No localizing pulmonary process. Electronically Signed   By: Jeb Levering M.D.   On: 12/14/2015 03:32   3:51 AM Patient's d-dimer is elevated and she has a known history of upper extremity DVT requiring Lovenox treatment. We will give Lovenox  and transfer her to [facility] ED for VQ scan and lower extremity Doppler. Discussed with Thayer Jew, MD.  4:22 AM Troponin is slightly elevated and was rechecked. This may represent a chronic mild elevation due to CHF or acute strain due to PE.   Shanon Rosser, MD 12/14/15 [place], MD 12/14/15 [id number]

## 2015-12-14 NOTE — ED Notes (Signed)
Admitting at bedside 

## 2015-12-14 NOTE — ED Notes (Signed)
Progressive SOB onset x 10 days

## 2015-12-15 LAB — BASIC METABOLIC PANEL
Anion gap: 6 (ref 5–15)
BUN: 11 mg/dL (ref 4–21)
BUN: 11 mg/dL (ref 6–20)
CHLORIDE: 115 mmol/L — AB (ref 101–111)
CO2: 20 mmol/L — AB (ref 22–32)
CREATININE: 1 mg/dL (ref 0.5–1.1)
CREATININE: 0.96 mg/dL (ref 0.44–1.00)
Calcium: 8.3 mg/dL — ABNORMAL LOW (ref 8.9–10.3)
GFR calc Af Amer: >60 mL/min (ref >60)
GFR calc non Af Amer: >60 mL/min (ref >60)
GLUCOSE: 98 mg/dL
Glucose, Bld: 98 mg/dL (ref 65–99)
Potassium: 3.9 mmol/L (ref 3.5–5.1)
SODIUM: 141 mmol/L (ref 137–147)
Sodium: 141 mmol/L (ref 135–145)

## 2015-12-15 LAB — CBC AND DIFFERENTIAL: WBC: 6.8 10*3/mL

## 2015-12-15 LAB — CBC
HEMATOCRIT: 44.3 % (ref 36.0–46.0)
Hemoglobin: 14.2 g/dL (ref 12.0–15.0)
MCH: 32.3 pg (ref 26.0–34.0)
MCHC: 32.1 g/dL (ref 30.0–36.0)
MCV: 100.7 fL — AB (ref 78.0–100.0)
PLATELETS: 132 10*3/uL — AB (ref 150–400)
RBC: 4.4 MIL/uL (ref 3.87–5.11)
RDW: 14.3 % (ref 11.5–15.5)
WBC: 6.8 10*3/uL (ref 4.0–10.5)

## 2015-12-15 LAB — PROTIME-INR
INR: 1.22 (ref 0.00–1.49)
Prothrombin Time: 15.6 seconds — ABNORMAL HIGH (ref 11.6–15.2)

## 2015-12-15 LAB — TSH: TSH: 1.24 u[IU]/mL (ref 0.350–4.500)

## 2015-12-15 MED ORDER — WARFARIN - PHARMACIST DOSING INPATIENT: Freq: Every day | Status: DC

## 2015-12-15 MED ORDER — WARFARIN VIDEO: Freq: Once | Status: DC

## 2015-12-15 MED ORDER — ENOXAPARIN SODIUM 150 MG/ML ~~LOC~~ SOLN: 1.0000 mg/kg | Freq: Two times a day (BID) | SUBCUTANEOUS | Status: DC

## 2015-12-15 MED ORDER — WARFARIN SODIUM 7.5 MG PO TABS
7.5000 mg | ORAL_TABLET | Freq: Once | ORAL | Status: AC
  Administered 2015-12-15: 7.5 mg via ORAL
  Filled 2015-12-15: qty 1

## 2015-12-15 MED ORDER — TRAMADOL HCL 50 MG PO TABS: 50.0000 mg | ORAL_TABLET | Freq: Four times a day (QID) | ORAL | Status: DC | PRN

## 2015-12-15 MED ORDER — WARFARIN SODIUM 7.5 MG PO TABS: 7.5000 mg | ORAL_TABLET | Freq: Every day | ORAL | Status: DC

## 2015-12-15 MED ORDER — COUMADIN BOOK
Freq: Once | Status: AC
  Administered 2015-12-15: 17:00:00
  Filled 2015-12-15: qty 1

## 2015-12-15 MED ORDER — ENOXAPARIN (LOVENOX) PATIENT EDUCATION KIT
PACK | Freq: Once | Status: AC
  Administered 2015-12-15: 17:00:00
  Filled 2015-12-15: qty 1

## 2015-12-15 NOTE — Clinical Social Work Placement (Signed)
   CLINICAL SOCIAL WORK PLACEMENT  NOTE  Date:  12/15/2015  Patient Details  Name: Azlin Dugay MRN: [id number] Date of Birth: 01-31-1951  Clinical Social Work is seeking post-discharge placement for this patient at the [place] level of care (*CSW will initial, date and re-position this form in  chart as items are completed):  Yes   Patient/family provided with [place] Work Department's list of facilities offering this level of care within the geographic area requested by the patient (or if unable, by the patient's family).  Yes   Patient/family informed of their freedom to choose among providers that offer the needed level of care, that participate in Medicare, Medicaid or managed care program needed by the patient, have an available bed and are willing to accept the patient.  Yes   Patient/family informed of [facility]'s ownership interest in [facility] and [facility], as well as of the fact that they are under no obligation to receive care at these facilities.  PASRR submitted to EDS on 12/15/15     PASRR number received on       Existing PASRR number confirmed on 12/15/15     FL2 transmitted to all facilities in geographic area requested by pt/family on 12/15/15     FL2 transmitted to all facilities within larger geographic area on       Patient informed that his/her managed care company has contracts with or will negotiate with certain facilities, including the following:            Patient/family informed of bed offers received.  Patient chooses bed at       Physician recommends and patient chooses bed at      Patient to be transferred to   on  .  Patient to be transferred to facility by       Patient family notified on   of transfer.  Name of family member notified:        PHYSICIAN Please sign FL2     Additional Comment:    _______________________________________________ Ross Ludwig, LCSWA 12/15/2015, 3:38  PM

## 2015-12-15 NOTE — Discharge Instructions (Signed)
Information on my medicine - Coumadin®   (Warfarin) ° °This medication education was reviewed with me or my healthcare representative as part of my discharge preparation.  The pharmacist that spoke with me during my hospital stay was:  [place], Aubrianna Orchard Danielle, RPH ° °Why was Coumadin prescribed for you? °Coumadin was prescribed for you because you have a blood clot or a medical condition that can cause an increased risk of forming blood clots. Blood clots can cause serious health problems by blocking the flow of blood to the heart, lung, or brain. Coumadin can prevent harmful blood clots from forming. °As a reminder your indication for Coumadin is:   Pulmonary Embolism Treatment ° °What test will check on my response to Coumadin? °While on Coumadin (warfarin) you will need to have an INR test regularly to ensure that your dose is keeping you in the desired range. The INR (international normalized ratio) number is calculated from the result of the laboratory test called prothrombin time (PT). ° °If an INR APPOINTMENT HAS NOT ALREADY BEEN MADE FOR YOU please schedule an appointment to have this lab work done by your health care provider within 7 days. °Your INR goal is usually a number between:  2 to 3 or your provider may give you a more narrow range like 2-2.5.  Ask your health care provider during an office visit what your goal INR is. ° °What  do you need to  know  About  COUMADIN? °Take Coumadin (warfarin) exactly as prescribed by your healthcare provider about the same time each day.  DO NOT stop taking without talking to the doctor who prescribed the medication.  Stopping without other blood clot prevention medication to take the place of Coumadin may increase your risk of developing a new clot or stroke.  Get refills before you run out. ° °What do you do if you miss a dose? °If you miss a dose, take it as soon as you remember on the same day then continue your regularly scheduled regimen the next day.  Do  not take two doses of Coumadin at the same time. ° °Important Safety Information °A possible side effect of Coumadin (Warfarin) is an increased risk of bleeding. You should call your healthcare provider right away if you experience any of the following: °  Bleeding from an injury or your nose that does not stop. °  Unusual colored urine (red or dark brown) or unusual colored stools (red or black). °  Unusual bruising for unknown reasons. °  A serious fall or if you hit your head (even if there is no bleeding). ° °Some foods or medicines interact with Coumadin® (warfarin) and might alter your response to warfarin. To help avoid this: °  Eat a balanced diet, maintaining a consistent amount of Vitamin K. °  Notify your provider about major diet changes you plan to make. °  Avoid alcohol or limit your intake to 1 drink for women and 2 drinks for men per day. °(1 drink is 5 oz. wine, 12 oz. beer, or 1.5 oz. liquor.) ° °Make sure that ANY health care provider who prescribes medication for you knows that you are taking Coumadin (warfarin).  Also make sure the healthcare provider who is monitoring your Coumadin knows when you have started a new medication including herbals and non-prescription products. ° °Coumadin® (Warfarin)  Major Drug Interactions  °Increased Warfarin Effect Decreased Warfarin Effect  °Alcohol (large quantities) °Antibiotics (esp. Septra/Bactrim, Flagyl, Cipro) °Amiodarone (Cordarone) °Aspirin (ASA) °Cimetidine (Tagamet) °  Feldene) Propafenone (Rythmol SR) Propranolol (Inderal) Isoniazid (INH) Posaconazole (Noxafil) Barbiturates (Phenobarbital) Carbamazepine (Tegretol) Chlordiazepoxide (Librium) Cholestyramine (Questran) Griseofulvin Oral Contraceptives Rifampin Sucralfate (Carafate) Vitamin K   Coumadin (Warfarin) Major Herbal Interactions  Increased Warfarin Effect Decreased Warfarin Effect  Garlic Ginseng Ginkgo  biloba Coenzyme Q10 Green tea St. Johns wort    Coumadin (Warfarin) FOOD Interactions  Eat a consistent number of servings per week of foods HIGH in Vitamin K (1 serving =  cup)  Collards (cooked, or boiled & drained) Kale (cooked, or boiled & drained) Mustard greens (cooked, or boiled & drained) Parsley *serving size only =  cup Spinach (cooked, or boiled & drained) Swiss chard (cooked, or boiled & drained) Turnip greens (cooked, or boiled & drained)  Eat a consistent number of servings per week of foods MEDIUM-HIGH in Vitamin K (1 serving = 1 cup)  Asparagus (cooked, or boiled & drained) Broccoli (cooked, boiled & drained, or raw & chopped) Brussel sprouts (cooked, or boiled & drained) *serving size only =  cup Lettuce, raw (green leaf, endive, romaine) Spinach, raw Turnip greens, raw & chopped   These websites have more information on Coumadin (warfarin):  FailFactory.se; VeganReport.com.au;

## 2015-12-15 NOTE — Progress Notes (Signed)
  Date: 12/15/2015  Patient name: Ana Jones  Medical record number: [id number]  Date of birth: Aug 23, 1950   I have seen and evaluated Ana Jones and discussed their care with the Residency Team. Ana Jones is a 65 yo with h/o morbid obesity who presented for progressive and disabling dyspnea. Her sxs started 10 days prior to admission with fatigue and progressed to DOE 3 days later which progressed to inability to perform ADL's. She has never had this severe dyspnea before and lives alone and is independent. She had R hip arthroplasty about 2 months ago and travelled by car to [place] 5 days prior to admission.   She had a provoked RUE DVT in 2014 while under tx for leukemia. She was tx with lovenox and warfarin. Her fam hx inc a brother with a PE in his 77's and a second brother with an embolic CVA in his [id number]. To her knowledge, no one has been tested for a hypercoag condition.   In the ED, her Well's score was 9, her D dimer was elevated, and her VQ was high prob for a RUL PE. She has been started on lovenox and warfarin.  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 Fam hx as per HPI, soc hx lives alone and is indep in AL"s.   Filed Vitals:   12/15/15 1202 12/15/15 1338  BP: 119/74 111/85  Pulse: 105 111  Temp: 97.5 F (36.4 C) 97.5 F (36.4 C)  Resp: 16 16   Increased WOB, no tachypnea, talking in full sentences Heart sounds are distant ABD + BS Ext tender to touch, no edema  I personally viewed her CXR images and confirmed by reading with the official read. No acute infiltrate. Calcified granuloma RUL  I personally viewed her EKG and confirmed by reading with the official read. Sinus tachy, Q waves inf leads, new since 2014  Dopplers - + subacute DVT R popliteal vein  Assessment and Plan: I have seen and evaluated the patient as outlined above. I agree with the formulated Assessment and Plan as detailed in the residents'  note, with the following changes:   1. Acute provoked PE - her inciting even could have been her R hip surgery which lead to her DVT which then caused her PE more recently. Therefore, she needs a minimum of 3 months of anticoag. However, since this is her 2nd PE, although both provoked, long term anticoag will ned to be considered. The pt will be d/C'd with instructions to continue anti-coag for a minimum of 3 months and discuss risks / benefits of longer anti-coag with her PCP. She is not yet quite hemodynamically stable for D/C - still tachy and will need O2 sat on ambulation.  2. Deconditioning - pt lives alone and is currently not indep for ADL's per PT. Will discuss SNF with pt  3. Inf Q waves, new since 2014 - She will need further mgmt as outpt as incidental finding. This could be part of S1 Q3 T3 as she does have a more prominent S wave in V1 than prior, the Q in III, but not sig TWI in III. EKG will need to be repeated as outpt and if Q waves persist, will need ischemic W/U once acute PE resolved.  Bartholomew Crews, MD 7/7/20172:13 PM

## 2015-12-15 NOTE — Clinical Social Work Note (Signed)
Clinical Social Work Assessment  Patient Details  Name: Ana Jones MRN: [id number] Date of Birth: June 21, 1950  Date of referral:  12/15/15               Reason for consult:  Facility Placement                Permission sought to share information with:  Facility Sport and exercise psychologist, Family Supports Permission granted to share information::  Yes, Verbal Permission Granted  Name::     Somia, Maxin [phone]  Agency::  SNF admissions  Relationship::     Contact Information:     Housing/Transportation Living arrangements for the past 2 months:  [place] of Information:  Patient Patient Interpreter Needed:  None Criminal Activity/Legal Involvement Pertinent to Current Situation/Hospitalization:  No - Comment as needed Significant Relationships:  Adult Children Lives with:  Self Do you feel safe going back to the place where you live?  No Need for family participation in patient care:  No (Coment)  Care giving concerns:  Patient feels she needs short term rehab before she is able to return back home.   Social Worker assessment / plan:  Patient is a 65 year old female who is alert and oriented x4, patient lives alone, and does not have any family support nearby.  Patient was sleepy but able to express her feelings.  Patient has been to rehab in the past, and is familiar with the process.  CSW explained the role of CSW and how insurance will pay for her stay.  Patient was not very talkative, but is in agreement to going to SNF for short term rehab.  Patient expressed that she did not have any other questions or concerns.  Patient plans to return back home once she has received therapy.  Employment status:  Disabled (Comment on whether or not currently receiving Disability) Insurance information:  Managed Medicare PT Recommendations:  [place] / Referral to community resources:     Patient/Family's Response to care:  Patient is in  agreement to going to SNF for short term rehab.  Patient/Family's Understanding of and Emotional Response to Diagnosis, Current Treatment, and Prognosis:  Patient is aware of current diagnosis and treatment plan.  Emotional Assessment Appearance:  Appears stated age Attitude/Demeanor/Rapport:    Affect (typically observed):  Appropriate, Calm Orientation:  Oriented to Self, Oriented to Place, Oriented to  Time, Oriented to Situation Alcohol / Substance use:  Not Applicable Psych involvement (Current and /or in the community):  No (Comment)  Discharge Needs  Concerns to be addressed:  No discharge needs identified, Lack of Support Readmission within the last 30 days:    Current discharge risk:  None, Lack of support system, Lives alone Barriers to Discharge:  No Barriers Identified   Anell Barr 12/15/2015, 3:31 PM

## 2015-12-15 NOTE — Care Management Important Message (Signed)
Important Message  Patient Details  Name: Ana Jones MRN: [id number] Date of Birth: [birth year]   Medicare Important Message Given:  Yes    Loann Quill 12/15/2015, 10:37 AM

## 2015-12-15 NOTE — Progress Notes (Signed)
Subjective: Patient feels like her dyspnea might be improving, though she notes she has only got up to go to the bathroom. Patient notes her right hip surgery was arthroscopic for clearing off bone spurs. Patient notes her brothers who had embolic events had them in their 21s. They have not been evaluated for thrombophilia. 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 Patient had a number of questions about pathogenesis, management plan and prognosis. She notes she was on Lovenox and warfarin for her past RUE DVT.   Objective: Vital signs in last 24 hours: Filed Vitals:   12/14/15 1224 12/14/15 1342 12/14/15 2310 12/15/15 0028  BP: 144/89 112/73 128/101 135/83  Pulse: 107 113 112 107  Temp:  97.9 F (36.6 C) 97.7 F (36.5 C)   TempSrc:  Oral Oral   Resp:  15 19 18   Height:      Weight:      SpO2:  96% 94% 95%   Weight change:   Intake/Output Summary (Last 24 hours) at 12/15/15 0827 Last data filed at 12/14/15 1901  Gross per 24 hour  Intake    400 ml  Output      0 ml  Net    400 ml   Gen: pleasant, obese woman lying in bed in NAD HEENT: anicteric sclera CV: RRR no mrg Pulm: Normal effort and rate, CTAB Ab: soft, NT, ND obese abdomen with + bowel sounds Extremities: Bilateral 1+ edema to mid shin, tender bilaterally, moreso on the left   Lab Results: @LABTEST2 @ Micro Results: No results found for this or any previous visit (from the past 240 hour(s)). Studies/Results: Dg Chest 2 View  12/14/2015  CLINICAL DATA:  Shortness of breath.  Right-sided chest pain. EXAM: CHEST  2 VIEW COMPARISON:  08/03/2014 FINDINGS: Lung volumes are low. The heart is enlarged, may be accentuated by technique. Mediastinal contours are unchanged. Calcified granuloma in the right upper lung. No consolidation to suggest pneumonia. No large pleural effusion or pneumothorax. No pulmonary edema. Detailed evaluation limited by soft tissue attenuation from body habitus.  IMPRESSION: Enlargement of the cardiac silhouette, may be accentuated by low lung volumes. No localizing pulmonary process. Electronically Signed   By: Jeb Levering M.D.   On: 12/14/2015 03:32   Nm Pulmonary Perf And Vent  12/14/2015  CLINICAL DATA:  Shortness of breath worsening dyspnea EXAM: NUCLEAR MEDICINE VENTILATION - PERFUSION LUNG SCAN TECHNIQUE: Ventilation images were obtained in multiple projections using inhaled aerosol Tc-49m DTPA. Perfusion images were obtained in multiple projections after intravenous injection of Tc-63m MAA. RADIOPHARMACEUTICALS:  Thirty-two MCi Technetium-92m DTPA aerosol inhalation and 4.3 mCi Technetium-35m MAA IV COMPARISON:  Chest x-ray 12/14/2015 FINDINGS: Ventilation: No focal ventilation defect. Perfusion: There is decreased perfusion with a large defect in right upper lobe. Chest x-ray shows no acute infiltrate or pulmonary edema. Findings are consistent with high probability for pulmonary embolus. IMPRESSION: High probability for pulmonary embolus with a large perfusion defect in right upper lobe. These results were called by telephone at the time of interpretation on 12/14/2015 at 11:04 am to Dr. Ripley Fraise , who verbally acknowledged these results. Electronically Signed   By: Lahoma Crocker M.D.   On: 12/14/2015 11:04   Medications: I have reviewed the patient's current medications.  Scheduled Meds: . cyclobenzaprine  10 mg Oral TID  . enoxaparin (LOVENOX) injection  1 mg/kg Subcutaneous Q12H  . enoxaparin   Does not apply Once  . sodium chloride flush  3  mL Intravenous Q12H  . topiramate  50 mg Oral QHS   Continuous Infusions:  PRN Meds:.acetaminophen **OR** acetaminophen, technetium TC 26M diethylenetriame-pentaacetic acid Assessment/Plan: Principal Problem:   Dyspnea Active Problems:   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HCC)   Obesity   History of myeloid leukemia   History of DVT (deep vein thrombosis)   PE (pulmonary thromboembolism)  ([place])  Ms. Mcquaide is a 65 yo F with PMHx of acute myelomonocytic leukemia diagnosed 06/2012 and in remission, obesity, h/o RUE DVT in 2014 who presented with complaint of DOE for 10 days.   Pulmonary embolism: V/Q scan on 7/6 showed large RUL perfusion defect consistent with PE. Patient remains hemodynamically stable with elevated BNP, troponin I and moderate RV dilation consistent with RV strain. LE doppler shows DVT in the R popliteal vein that appears subacute to chronic in nature, likely developed during convalescence from R hip arthroscopy 2 months ago. Patient does have personal history of RUE DVT while she had active leukemia, and has 2 brothers with thromboembolic events in their [id number], which suggests she has genetic predisposition to thrombophilia (Favtor V leiden, antithrombin, protein c,s deficiency, prothrombin mutation). Given this is the second DVT and potential genetic predisposition, patient may require lifelong anticoagulation. This should be evaluated further in the outpatient setting. Patient will be started on warfarin and sent home with Lovenox, which she has tolerated well in the past.   -- continue lovenox per pharmacy -- start warfarin 5mg   -- PT assessment and ambulatory O2 saturation   History of Acute Myelomonocytic Leukemia: Diagnosed and treated in 2014. Went into remission after 1 cycle of chemotherapy with Daunorubicin and Ara-C. Follow ups have been normal.   DVT/PE ppx: Therapeutic Lovenox CODE: FULL FEN: HH  Dispo: Admit patient to Observation with expected length of stay less than 2 midnights.  This is a Careers information officer Note.  The care of the patient was discussed with Dr. Jacques Earthly and the assessment and plan formulated with their assistance.  Please see their attached note for official documentation of the daily encounter.   LOS: 1 day   Evert Kohl, Med Student 12/15/2015, 8:27 AM

## 2015-12-15 NOTE — Progress Notes (Signed)
[facility] check Lovenox 165 mg SQ q12h  being processed, CM to f/u with results. Whitman Hero RN,BSN,CM [phone]

## 2015-12-15 NOTE — Progress Notes (Signed)
   Subjective: Some improvement overall this morning. She has not ambulated. Still has some dyspnea.   Objective: Vital signs in last 24 hours: Filed Vitals:   12/14/15 1224 12/14/15 1342 12/14/15 2310 12/15/15 0028  BP: 144/89 112/73 128/101 135/83  Pulse: 107 113 112 107  Temp:  97.9 F (36.6 C) 97.7 F (36.5 C)   TempSrc:  Oral Oral   Resp:  15 19 18   Height:      Weight:      SpO2:  96% 94% 95%   Physical Exam General Apperance: NAD HEENT: Normocephalic, atraumatic, anicteric sclera Neck: Supple, trachea midline Lungs: Clear to auscultation bilaterally. No wheezes, rhonchi or rales. Breathing comfortably on room air Heart: Regular rate and rhythm, no murmur/rub/gallop Abdomen: Soft, nontender, nondistended, no rebound/guarding Extremities: Warm and well perfused Neurologic: Alert and interactive. No gross deficits.   Assessment/Plan: 65 year old woman with acute myelomonocytic leukemia diagnosed 06/2012 and in remission, obesity, h/o RUE DVT in 2014 who presented with complaint of DOE for 10 days.  PE: VQ scan with large perfusion defect in RUL. Echo with LV EF 55-60%, PA peak pressure 37 mmHg. TSH wnl. -Lovenox bridge. Start warfarin today. -PT eval  FEN: HH CODE: FULL   Dispo: Anticipated discharge in approximately 0-1 day(s).   LOS: 1 day   Milagros Loll, MD 12/15/2015, 7:14 AM Pager: [phone]

## 2015-12-15 NOTE — Clinical Social Work Note (Addendum)
CSW was given permission by patient to begin bed search in [facility].  Patient does not have any bed offers yet, CSW continuing to follow patient's progress throughout discharge planning.  Jones Broom. [place], MSW, [place] 12/15/2015 4:51 PM

## 2015-12-15 NOTE — Evaluation (Addendum)
Physical Therapy Evaluation Patient Details Name: Ana Jones MRN: [id number] DOB: February 01, 1951 Today's Date: 12/15/2015   History of Present Illness  Patient is a 65 y/o female with hx of leukemia, polio, DVT, and sleep apnea presents with SOB. D-dimer-6.24; Venous Duplex-Right leg is positive for chronic appearing deep vein thrombosis involving the popliteal vein. V/Q scan-High probability for pulmonary embolus with a large perfusion defect in right upper lobe.   Clinical Impression  Patient presents with dyspnea on exertion, left knee instability and impaired endurance/mobility s/p above. Pt only able to tolerate ambulating short distances, ~30-40 feet with increased effort resulting in dizziness and lightheadedness. Reports multiple falls at home due to left knee issues. Pt lives alone and is not safe to return there at this time. Eager to return to PLOF. Encouraged OOB to chair and ambulation multiple times per day with nursing to improve endurance and mobility. Would benefit from short tern SNF to maximize independence and mobility prior to return home.    Follow Up Recommendations SNF    Equipment Recommendations  None recommended by PT    Recommendations for Other Services       Precautions / Restrictions Precautions Precautions: Fall Precaution Comments: watch HR Restrictions Weight Bearing Restrictions: No      Mobility  Bed Mobility Overal bed mobility: Needs Assistance Bed Mobility: Sit to Supine       Sit to supine: Supervision;HOB elevated   General bed mobility comments: Able to bring BLEs into bed using body momentum and UEs. No assist needed.  Transfers Overall transfer level: Needs assistance Equipment used: Rolling walker (2 wheeled) Transfers: Sit to/from Stand Sit to Stand: Supervision         General transfer comment: Supervision for safety. Pt scared of getting short of breath. Stood from [employer].   Ambulation/Gait Ambulation/Gait assistance: Min  guard Ambulation Distance (Feet): 40 Feet Assistive device: Rolling walker (2 wheeled) Gait Pattern/deviations: Step-through pattern;Decreased stride length;Wide base of support Gait velocity: decreased Gait velocity interpretation: Below normal speed for age/gender General Gait Details: Mildly unsteady gait using RW for support; wide BoS due to body habitus. 3/4 DOE with light headedness and dizziness. HR ranged from 106-126 bpm, Sp02 >92% on RA.   Stairs            Wheelchair Mobility    Modified Rankin (Stroke Patients Only)       Balance Overall balance assessment: Needs assistance Sitting-balance support: Feet supported;No upper extremity supported Sitting balance-Leahy Scale: Good Sitting balance - Comments: Able to donn sock sitting EOB bringing foot onto bed.    Standing balance support: During functional activity Standing balance-Leahy Scale: Poor Standing balance comment: Reliant on UE support in standing.                             Pertinent Vitals/Pain Pain Assessment: No/denies pain    Home Living Family/patient expects to be discharged to:: Private residence Living Arrangements: Alone Available Help at Discharge: Family;Available PRN/intermittently Type of Home: House Home Access: Level entry     Home Layout: One level Home Equipment: Walker - 4 wheels;Wheelchair - manual;Cane - single point      Prior Function Level of Independence: Independent with assistive device(s)         Comments: Pt  uses SPC vs rollator for community ambulation. Furniture walker for household ambulation. Reports falls due to left knee buckling (needs to be replaced). Does ADLs, IADls with rest breaks.  Has med alert button.     Hand Dominance        Extremity/Trunk Assessment   Upper Extremity Assessment: Defer to OT evaluation           Lower Extremity Assessment: Overall WFL for tasks assessed         Communication   Communication: No  difficulties  Cognition Arousal/Alertness: Awake/alert Behavior During Therapy: WFL for tasks assessed/performed Overall Cognitive Status: Within Functional Limits for tasks assessed                      General Comments      Exercises        Assessment/Plan    PT Assessment Patient needs continued PT services  PT Diagnosis Difficulty walking   PT Problem List Decreased strength;Cardiopulmonary status limiting activity;Decreased activity tolerance;Decreased balance;Decreased mobility  PT Treatment Interventions Balance training;Gait training;Functional mobility training;Therapeutic activities;Therapeutic exercise;Patient/family education   PT Goals (Current goals can be found in the Care Plan section) Acute Rehab PT Goals Patient Stated Goal: to go to rehab to improve independence PT Goal Formulation: With patient Time For Goal Achievement: 12/29/15 Potential to Achieve Goals: Fair    Frequency Min 2X/week   Barriers to discharge Decreased caregiver support lives alone    Co-evaluation               End of Session Equipment Utilized During Treatment: Gait belt Activity Tolerance: Patient limited by fatigue (SOB.) Patient left: in bed;with call bell/phone within reach;with bed alarm set Nurse Communication: Mobility status         Time: 1335-1404 PT Time Calculation (min) (ACUTE ONLY): 29 min   Charges:   PT Evaluation $PT Eval Moderate Complexity: 1 Procedure PT Treatments $Gait Training: 8-22 mins   PT G Codes:        Walther Sanagustin A Verlee Pope 12/15/2015, 2:28 PM Wray Kearns, [place], DPT [phone]

## 2015-12-15 NOTE — NC FL2 (Signed)
[place] LEVEL OF CARE SCREENING TOOL     IDENTIFICATION  Patient Name: Ana Jones Birthdate: 09/12/1950 Sex: female Admission Date (Current Location): 12/14/2015  [facility] and [place] Number:  Herbalist and Address:  [facility], [place] [address] [place], [place] [address]      Provider Number: [id number]  Attending Physician Name and Address:  Bartholomew Crews, MD  Relative Name and Phone Number:  Smither,[phone]    Current Level of Care: Hospital Recommended Level of Care: [place] Prior Approval Number:    Date Approved/Denied:   PASRR Number: [id number]  Discharge Plan: SNF    Current Diagnoses: Patient Active Problem List   Diagnosis Date Noted  . Dyspnea 12/14/2015  . 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[place]) 12/14/2015  . Obesity 12/14/2015  . History of myeloid leukemia 12/14/2015  . History of DVT (deep vein thrombosis) 12/14/2015  . PE (pulmonary thromboembolism) ([place]) 12/14/2015    Orientation RESPIRATION BLADDER Height & Weight     Self, Time, Situation, Place  Normal Continent Weight: (!) 365 lb (165.563 kg) Height:  5' 5"  (165.1 cm)  BEHAVIORAL SYMPTOMS/MOOD NEUROLOGICAL BOWEL NUTRITION STATUS      Continent Diet  AMBULATORY STATUS COMMUNICATION OF NEEDS Skin   Limited Assist Verbally                         Personal Care Assistance Level of Assistance  Bathing, Feeding, Dressing Bathing Assistance: Limited assistance Feeding assistance: Limited assistance Dressing Assistance: Limited assistance     Functional Limitations Info  Hearing, Speech, Sight Sight Info: Adequate Hearing Info: Adequate Speech Info: Adequate    SPECIAL CARE FACTORS FREQUENCY  PT (By licensed PT)     PT Frequency: 5x a week              Contractures Contractures Info: Not present    Additional Factors Info  Allergies   Allergies Info: CONTRAST MEDIA,  SULFAMETHOXAZOLE, SULFA ANTIBIOTICS           Current Medications (12/15/2015):  This is the current hospital active medication list Current Facility-Administered Medications  Medication Dose Route Frequency Provider Last Rate Last Dose  . acetaminophen (TYLENOL) tablet 650 mg  650 mg Oral Q6H PRN Alexa Angela Burke, MD   650 mg at 12/15/15 0007   Or  . acetaminophen (TYLENOL) suppository 650 mg  650 mg Rectal Q6H PRN Alexa Angela Burke, MD      . coumadin book   Does not apply Once Alvira Philips, [facility]      . cyclobenzaprine (FLEXERIL) tablet 10 mg  10 mg Oral TID Florinda Marker, MD   10 mg at 12/15/15 0811  . enoxaparin (LOVENOX) injection 165 mg  1 mg/kg Subcutaneous Q12H Alexa Angela Burke, MD   165 mg at 12/15/15 4944  . enoxaparin (LOVENOX) patient education kit   Does not apply Once Milagros Loll, MD      . sodium chloride flush (NS) 0.9 % injection 3 mL  3 mL Intravenous Q12H Alexa Angela Burke, MD   3 mL at 12/15/15 0811  . technetium TC 50M diethylenetriame-pentaacetic acid (DTPA) injection 31 milli Curie  31 milli Curie Intravenous Once PRN Gus Height, MD      . topiramate (TOPAMAX) tablet 50 mg  50 mg Oral QHS Alexa Angela Burke, MD   50 mg at 12/14/15 2346  . warfarin (COUMADIN) tablet 7.5 mg  7.5 mg Oral ONCE-1800 Donalynn Furlong Brincker[place], [facility]      . warfarin (COUMADIN) video   Does not apply Once [place], [facility]      . Warfarin - Pharmacist Dosing Inpatient   Does not apply [place], [facility]   0  at 12/15/15 1800     Discharge Medications: Please see discharge summary for a list of discharge medications.  Relevant Imaging Results:  Relevant Lab Results:   Additional Information SSN [id number]  Ross Ludwig, [place]

## 2015-12-15 NOTE — Discharge Summary (Signed)
Name: Ana Jones MRN: [id number] DOB: [birth year] 65 y.o. PCP: Daphene Calamity, MD  Date of Admission: 12/14/2015  1:14 AM Date of Discharge: 12/16/2015 Attending Physician: Bartholomew Crews, MD  Discharge Diagnosis: Principal Problem:   PE (pulmonary thromboembolism) ([place]) Active Problems:   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HCC)   Obesity   History of myeloid leukemia   History of DVT (deep vein thrombosis)   Discharge Medications:   Medication List    STOP taking these medications        acyclovir 400 MG tablet  Commonly known as:  ZOVIRAX     diphenhydrAMINE 25 MG tablet  Commonly known as:  BENADRYL     docusate sodium 100 MG capsule  Commonly known as:  COLACE     fluconazole 200 MG tablet  Commonly known as:  DIFLUCAN     levofloxacin 500 MG tablet  Commonly known as:  LEVAQUIN     mometasone 50 MCG/ACT nasal spray  Commonly known as:  NASONEX     polyethylene glycol packet  Commonly known as:  MIRALAX / GLYCOLAX     prochlorperazine 10 MG tablet  Commonly known as:  COMPAZINE     zolpidem 5 MG tablet  Commonly known as:  AMBIEN      TAKE these medications        cyclobenzaprine 10 MG tablet  Commonly known as:  FLEXERIL  Take 10 mg by mouth 3 (three) times daily as needed for muscle spasms.     enoxaparin 150 MG/ML injection  Commonly known as:  LOVENOX  Inject 1.1 mLs (165 mg total) into the skin every 12 (twelve) hours.     tolterodine 2 MG 24 hr capsule  Commonly known as:  DETROL LA  Take 2 mg by mouth daily.     topiramate 50 MG tablet  Commonly known as:  TOPAMAX  Take 50 mg by mouth at bedtime.     traMADol 50 MG tablet  Commonly known as:  ULTRAM  Take 1 tablet (50 mg total) by mouth every 6 (six) hours as needed for moderate pain.     warfarin 7.5 MG tablet  Commonly known as:  COUMADIN  Take 1 tablet (7.5 mg total) by mouth daily at 6 PM.        Disposition and follow-up:   Ms.Ana Jones was discharged  from [facility] in Stable condition.  At the hospital follow up visit please address:  1.  Recurrent VTE: Follow up coumadin and INR. Consider life long anticoagulation.   She has Q waves in III, new since 2014. Repeat EKG at follow up. If Q wave persists, needs ischemic workup.  2.  Labs / imaging needed at time of follow-up: PT/INR, CBC, EKG  3.  Pending labs/ test needing follow-up: None  Follow-up Appointments: Follow-up Information    Follow up with Daphene Calamity, MD. Go on 12/18/2015.   Specialty:  Family Medicine   Why:  hospital follow-up and establish warfarin care appointment at 12:00PM   Contact information:   [place] [phone]       Hospital Course by problem list:   1. Pulmonary embolism: 65 year old woman with history of acute myelomonocytic leukemia in 2014 in remission, post-polio syndrome, RUE DVT in 2014, obesity and obstructive sleep apnea presented to [place] with 10 day history of progressive dyspnea on exertion with severe limitation of daily activities. She was tachycardic, SpO2 93% on RA but otherwise  hemodynamically stable. Lower extremity doppler shows DVT in the right popliteal vein that appears subacute to chronic in nature, likely developed during convalescence from R hip arthroscopy 2 months ago. V/Q scan showed large right upper lobe perfusion defect - high probability for pulmonary embolism. Admission labs were notable for PLTs 127, Bicarb 20, BNP 529, troponin I 0.04 and echocardiography showed moderate RV dilation consistent with RV strain.  Patient does have personal history of right upper extremity DVT while she had active leukemia, and has 2 brothers with thromboembolic events in their [id number], which suggests she has genetic predisposition to thrombophilia (ie Factor V leiden, antithrombin or protein c/s deficiency, prothrombin mutation). Given this is the second DVT and potential genetic predisposition,  patient may require lifelong anticoagulation. Genetic thrombophilia can be considered for further evaluation in the outpatient setting. Patient was started on Lovenox bridge to warfarin. PT assessed the patient and recommended SNF for convalescence. At discharge she was hemodynamically stable. She was discharged home with 24 hour supervision and plans to present to her SNF on Monday when the bariatric bed will be available.  2. Abnormal EKG: Q wave in III which could be part of S1Q3T3 in setting of PE. Will need repeat as outpatient and ischemic workup if persists.  Discharge Vitals:   BP 103/66 mmHg  Pulse 101  Temp(Src) 97.7 F (36.5 C) (Oral)  Resp 18  Ht 5\' 5"  (1.651 m)  Wt 365 lb (165.563 kg)  BMI 60.74 kg/m2  SpO2 94%  Pertinent Labs, Studies, and Procedures:  BMP Latest Ref Rng 12/15/2015 12/14/2015 09/14/2012  Glucose 65 - 99 mg/dL 98 105(H) 119(H)  BUN 6 - 20 mg/dL 11 20 15   Creatinine 0.44 - 1.00 mg/dL 0.96 1.12(H) 1.50(H)  Sodium 135 - 145 mmol/L 141 140 141  Potassium 3.5 - 5.1 mmol/L 3.9 4.3 3.4(L)  Chloride 101 - 111 mmol/L 115(H) 112(H) 105  CO2 22 - 32 mmol/L 20(L) 20(L) -  Calcium 8.9 - 10.3 mg/dL 8.3(L) 8.7(L) -    CBC Latest Ref Rng 12/15/2015 12/14/2015 09/14/2012  WBC 4.0 - 10.5 K/uL 6.8 8.3 -  Hemoglobin 12.0 - 15.0 g/dL 14.2 14.9 12.2  Hematocrit 36.0 - 46.0 % 44.3 44.9 36.0  Platelets 150 - 400 K/uL 132(L) 127(L) -    Discharge Instructions: Discharge Instructions    Call MD for:  difficulty breathing, headache or visual disturbances    Complete by:  As directed      Call MD for:  extreme fatigue    Complete by:  As directed      Call MD for:  persistant dizziness or light-headedness    Complete by:  As directed      Call MD for:  severe uncontrolled pain    Complete by:  As directed      Call MD for:  temperature >100.4    Complete by:  As directed      Diet - low sodium heart healthy    Complete by:  As directed      Discharge instructions    Complete by:   As directed   You were hospitalized for a pulmonary embolism. You were started on Lovenox injections which you should continue twice a day. You were also started on warfarin (Coumadin) which you should continue at home once a day. Please continue your other home medications.     Increase activity slowly    Complete by:  As directed  Signed: Milagros Loll, MD 12/16/2015, 8:16 AM   Pager: [phone]

## 2015-12-15 NOTE — Progress Notes (Signed)
ANTICOAGULATION CONSULT NOTE - Follow Up Consult  Pharmacy Consult for Lovenox, warfarin Indication: pulmonary embolus and DVT  Allergies  Allergen Reactions  . Contrast Media [Iodinated Diagnostic Agents] Anaphylaxis  . Sulfamethoxazole Itching and Swelling  . Sulfa Antibiotics Rash    Patient Measurements: Height: 5\' 5"  (165.1 cm) Weight: (!) 365 lb (165.563 kg) IBW/kg (Calculated) : 57  Vital Signs: BP: 135/83 mmHg (07/07 0028) Pulse Rate: 107 (07/07 0028)  Labs:  Recent Labs  12/14/15 0254 12/15/15 0432 12/15/15 0758  HGB 14.9 14.2  --   HCT 44.9 44.3  --   PLT 127* 132*  --   LABPROT  --   --  15.6*  INR  --   --  1.22  CREATININE 1.12* 0.96  --   TROPONINI 0.04*  --   --     Estimated Creatinine Clearance: 95.1 mL/min (by C-G formula based on Cr of 0.96).   Assessment: 65 y/o obese female admitted with SOB and found to have high probability of PE on VQ scan and chronic right DVT. Pharmacy consulted to manage inpatient Lovenox to warfarin bridge.  Today is day 1 of Lovenox to warfarin bridge. Bridge to continue a minimum of 5 days AND until INR >= 2 for 24 hours.  INR is subtherapeutic at 1.22. No bleeding noted, Hgb is stable, platelets are low stable.  Goal of Therapy:  INR 2-3 Anti-Xa level 0.6-1 units/ml 4hrs after LMWH dose given Monitor platelets by anticoagulation protocol: Yes   Plan:  Continue Lovenox 165 mg SQ q12h Warfarin 7.5 mg PO tonight INR daily, CBC q72h while on Lovenox Monitor for s/sx of bleeding  Renold Genta, PharmD, BCPS Clinical Pharmacist Pager: [phone] 12/15/2015 11:35 AM

## 2015-12-16 DIAGNOSIS — I2699 Other pulmonary embolism without acute cor pulmonale: Principal | ICD-10-CM

## 2015-12-16 LAB — PROTIME-INR
INR: 1.16 (ref 0.00–1.49)
Prothrombin Time: 14.9 seconds (ref 11.6–15.2)

## 2015-12-16 MED ORDER — WARFARIN SODIUM 7.5 MG PO TABS: 7.5000 mg | ORAL_TABLET | Freq: Every day | ORAL | Status: DC

## 2015-12-16 MED ORDER — ENOXAPARIN SODIUM 300 MG/3ML IJ SOLN: 1.0000 mg/kg | Freq: Two times a day (BID) | INTRAMUSCULAR | Status: DC

## 2015-12-16 MED ORDER — WARFARIN SODIUM 7.5 MG PO TABS
7.5000 mg | ORAL_TABLET | Freq: Once | ORAL | Status: AC
  Administered 2015-12-16: 7.5 mg via ORAL
  Filled 2015-12-16: qty 1

## 2015-12-16 NOTE — Progress Notes (Signed)
CM received call to arrange PTAR. CM arranged PTAR and called for transport. CM remains available should additional need arise.

## 2015-12-16 NOTE — Clinical Social Work Note (Addendum)
Patient chooses bed at [facility]. Will be medically stable for d/c today per MD. Currently awaiting completion of d/c summary.   12:08PM- [facility] unable to obtain bariatric bed over the weekend. Facility unable to admit patient until Monday when bariatric bed is available. CSW to follow up with patient in regards to alternative bed offers.   Glendon Axe, MSW, LCSWA [phone] 12/16/2015 11:42 AM

## 2015-12-16 NOTE — Progress Notes (Signed)
  Date: 12/16/2015  Patient name: Ana Jones  Medical record number: [id number]  Date of birth: [birth year]   This patient has been seen and the plan of care was discussed with the house staff. Please see their note for complete details. I concur with their findings with the following additions/corrections: Ms Veeneman was eating breakfast and only complaints were the tele ws bothering her and her heart healthy diet. She is amenable to SNF and waiting on placement. Will need 3 months minimum of anticoag and then discussion of risks / benefits. Will need outpt EKG to F/U Q in lead 3.  Bartholomew Crews, MD 12/16/2015, 1:51 PM

## 2015-12-16 NOTE — Clinical Social Work Note (Signed)
[facility] able to accommodate patient's needs for bariatric bed over the weekend. These are the only SNF's able to accommodate patient for d/c today.   CSW has discussed available options with patient. Patient currently reviewing facilities online for ratings and additional informaton.   CSW remains available as needed.   Glendon Axe, MSW, LCSWA [phone] 12/16/2015 2:31 PM

## 2015-12-16 NOTE — Progress Notes (Addendum)
Nsg Discharge Note  Admit Date:  12/14/2015 Discharge date: 12/16/2015   Ana Jones to be D/C'd Home temporarily until she can get a bariatric bed at [facility], which will be Monday 12/18/15 per MD order.  AVS completed.  Copy for chart, and copy for patient signed, and dated. Patient/caregiver able to verbalize understanding.  Discharge Medication:   Medication List    STOP taking these medications        acyclovir 400 MG tablet  Commonly known as:  ZOVIRAX     diphenhydrAMINE 25 MG tablet  Commonly known as:  BENADRYL     docusate sodium 100 MG capsule  Commonly known as:  COLACE     fluconazole 200 MG tablet  Commonly known as:  DIFLUCAN     levofloxacin 500 MG tablet  Commonly known as:  LEVAQUIN     mometasone 50 MCG/ACT nasal spray  Commonly known as:  NASONEX     polyethylene glycol packet  Commonly known as:  MIRALAX / GLYCOLAX     prochlorperazine 10 MG tablet  Commonly known as:  COMPAZINE     zolpidem 5 MG tablet  Commonly known as:  AMBIEN      TAKE these medications        cyclobenzaprine 10 MG tablet  Commonly known as:  FLEXERIL  Take 10 mg by mouth 3 (three) times daily as needed for muscle spasms.     enoxaparin 300 MG/3ML Soln injection  Commonly known as:  LOVENOX  Inject 1.66 mLs (166 mg total) into the skin every 12 (twelve) hours.     tolterodine 2 MG 24 hr capsule  Commonly known as:  DETROL LA  Take 2 mg by mouth daily.     topiramate 50 MG tablet  Commonly known as:  TOPAMAX  Take 50 mg by mouth at bedtime.     traMADol 50 MG tablet  Commonly known as:  ULTRAM  Take 1 tablet (50 mg total) by mouth every 6 (six) hours as needed for moderate pain.     warfarin 7.5 MG tablet  Commonly known as:  COUMADIN  Take 1 tablet (7.5 mg total) by mouth daily at 6 PM.        Discharge Assessment: Filed Vitals:   12/16/15 0551 12/16/15 1420  BP: 103/66 103/74  Pulse: 101 103  Temp: 97.7 F (36.5 C) 98.2 F (36.8 C)   Resp: 18 18   Skin clean, dry and intact without evidence of skin break down, no evidence of skin tears noted. IV catheter discontinued intact. Site without signs and symptoms of complications - no redness or edema noted at insertion site, patient denies c/o pain - only slight tenderness at site.  Dressing with slight pressure applied.  D/c Instructions-Education: Discharge instructions given to patient/family with verbalized understanding. D/c education completed with patient/family including follow up instructions, medication list, d/c activities limitations if indicated, with other d/c instructions as indicated by MD - patient able to verbalize understanding, all questions fully answered. Patient instructed to return to ED, call 911, or call MD for any changes in condition.  Patient transported home via [place].  PTAR has not yet arrived. 18:05  Salley Slaughter, RN 12/16/2015 3:58 PM

## 2015-12-16 NOTE — Progress Notes (Signed)
ANTICOAGULATION CONSULT NOTE - Follow Up Consult  Pharmacy Consult for Lovenox, warfarin Indication: pulmonary embolus and DVT  Allergies  Allergen Reactions  . Contrast Media [Iodinated Diagnostic Agents] Anaphylaxis  . Sulfamethoxazole Itching and Swelling  . Sulfa Antibiotics Rash    Patient Measurements: Height: 5\' 5"  (165.1 cm) Weight: (!) 365 lb (165.563 kg) IBW/kg (Calculated) : 57  Vital Signs: Temp: 97.7 F (36.5 C) (07/08 0551) Temp Source: Oral (07/08 0551) BP: 103/66 mmHg (07/08 0551) Pulse Rate: 101 (07/08 0551)  Labs:  Recent Labs  12/14/15 0254 12/15/15 0432 12/15/15 0758 12/16/15 0442  HGB 14.9 14.2  --   --   HCT 44.9 44.3  --   --   PLT 127* 132*  --   --   LABPROT  --   --  15.6* 14.9  INR  --   --  1.22 1.16  CREATININE 1.12* 0.96  --   --   TROPONINI 0.04*  --   --   --     Estimated Creatinine Clearance: 95.1 mL/min (by C-G formula based on Cr of 0.96).   Assessment: 65 y/o obese female admitted with SOB and found to have high probability of PE on VQ scan and chronic right DVT. Pharmacy consulted to manage inpatient Lovenox to warfarin bridge.  Today is day 2 of Lovenox to warfarin bridge. Bridge to continue a minimum of 5 days AND until INR >= 2 for 24 hours.  INR is subtherapeutic at 1.16. No bleeding noted, no new CBC.  Goal of Therapy:  INR 2-3 Anti-Xa level 0.6-1 units/ml 4hrs after LMWH dose given Monitor platelets by anticoagulation protocol: Yes   Plan:  Continue Lovenox 165 mg SQ q12h Repeat warfarin 7.5 mg PO tonight INR daily, CBC q72h while on Lovenox Monitor for s/sx of bleeding  Renold Genta, PharmD, BCPS Clinical Pharmacist Pager: [phone] 12/16/2015 10:56 AM

## 2015-12-16 NOTE — Progress Notes (Signed)
   Subjective: Feels better overall. No dyspnea at rest. Ambulating to bathroom and has dyspnea on exertion. Denies chest pain.   Objective: Vital signs in last 24 hours: Filed Vitals:   12/15/15 1338 12/15/15 2302 12/15/15 2310 12/16/15 0551  BP: 111/85 139/97 114/74 103/66  Pulse: 111 106 106 101  Temp: 97.5 F (36.4 C) 97.9 F (36.6 C) 97.9 F (36.6 C) 97.7 F (36.5 C)  TempSrc:  Oral Oral Oral  Resp: 16 18  18   Height:      Weight:      SpO2: 93% 97% 100% 94%   Physical Exam General Apperance: NAD HEENT: Normocephalic, atraumatic, anicteric sclera Neck: Supple, trachea midline Lungs: Clear to auscultation bilaterally. No wheezes, rhonchi or rales. Breathing comfortably on room air Heart: Regular rate and rhythm, no murmur/rub/gallop Abdomen: Soft, nontender, nondistended, no rebound/guarding Extremities: Warm and well perfused Neurologic: Alert and interactive. No gross deficits.  Assessment/Plan: 65 year old woman with acute myelomonocytic leukemia diagnosed 06/2012 and in remission, obesity, h/o RUE DVT in 2014 who presented with complaint of DOE for 10 days.  PE: BP remains stable. INR 1.16 this morning. -Lovenox bridge to warfarin.  FEN: HH CODE: FULL  Dispo: Awaiting SNF   LOS: 2 days   Milagros Loll, MD 12/16/2015, 8:17 AM Pager: [phone]

## 2015-12-16 NOTE — Progress Notes (Signed)
  Subjective: Patient feels about the same. She notes breathing has been about the same but she has only gotten up to go to the bathroom. She notes feeling drowsy after the Flexeril. Her son and Chauncey Reading is out of the country to [place] for a week. She is glad to be going to rehab for recovery.   Objective: Vital signs in last 24 hours: Filed Vitals:   12/15/15 1338 12/15/15 2302 12/15/15 2310 12/16/15 0551  BP: 111/85 139/97 114/74 103/66  Pulse: 111 106 106 101  Temp: 97.5 F (36.4 C) 97.9 F (36.6 C) 97.9 F (36.6 C) 97.7 F (36.5 C)  TempSrc:  Oral Oral Oral  Resp: 16 18  18   Height:      Weight:      SpO2: 93% 97% 100% 94%   Weight change:   Intake/Output Summary (Last 24 hours) at 12/16/15 1102 Last data filed at 12/16/15 0946  Gross per 24 hour  Intake    630 ml  Output      0 ml  Net    630 ml   Gen: Pleasant, obese woman lying in bed in NAD CV: RRR no m/r/g Pulm: CTAB, normal rate and effort Abdomen: Soft, NT, ND, + bowel sounds Extremities: trace edema to ankles, pulses 2+ x 4 extremities  Skin: warm and dry, no lesions appreciated   Lab Results: @LABTEST2 @ Micro Results: No results found for this or any previous visit (from the past 240 hour(s)). Studies/Results: No results found.   Medications: I have reviewed the patient's current medications. Scheduled Meds: . cyclobenzaprine  10 mg Oral TID  . enoxaparin (LOVENOX) injection  1 mg/kg Subcutaneous Q12H  . sodium chloride flush  3 mL Intravenous Q12H  . topiramate  50 mg Oral QHS  . warfarin  7.5 mg Oral ONCE-1800  . warfarin   Does not apply Once  . Warfarin - Pharmacist Dosing Inpatient   Does not apply q1800   Continuous Infusions:  PRN Meds:.acetaminophen **OR** acetaminophen, technetium TC 29M diethylenetriame-pentaacetic acid   Assessment/Plan: Principal Problem:   Dyspnea Active Problems:   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HCC)   Obesity   History of myeloid leukemia   History of  DVT (deep vein thrombosis)   PE (pulmonary thromboembolism) ([place])  Ms. Gurrero is a 65 yo F with PMHx of acute myelomonocytic leukemia diagnosed 06/2012 and in remission, obesity, h/o RUE DVT in 2014 who presented with complaint of DOE for 10 days, found with large perfusion defect in RUL on V/Q scan consistent with PE.   Pulmonary embolism: Patient is hemodynamically stable and her symptoms are stable. INR 1.16 this AM.  V/Q scan on 7/6 showed large RUL perfusion defect consistent with PE. LE doppler shows DVT in the R popliteal vein that appears subacute to chronic in nature, likely developed during convalescence from R hip arthroscopy 2 months ago. PT recommended discharge to SNF, currently awaiting bed.  -- continue lovenox per pharmacy -- continue warfarin per pharmacy  -- continue cardiac monitoring    This is a Medical Student Note.  The care of the patient was discussed with Dr. Jacques Earthly and the assessment and plan formulated with their assistance.  Please see their attached note for official documentation of the daily encounter.   LOS: 2 days   Evert Kohl, Med Student 12/16/2015, 11:02 AM

## 2015-12-16 NOTE — Clinical Social Work Note (Addendum)
Patient has declined bed offers from [facility] and [facility] [place]. Patient reported that she plans to return home and admit into [facility] first thing Monday morning.   CSW confirmed plans with Cameilla, wknd admissions director who reported that facility will be prepared to admit patient on Monday morning. Facility has placed orders for bariatric bed to be delivered Monday, 7/10 at [facility] has also confirmed that patient is able to admit to facility from home under [facility] Medicare guidelines. Facility has also communicated this information directly to patient.   Patient reported that her sister and cousin will stay over night with her tonight, 7/8 however will be traveling out of town Sunday, 7/9 morning. Patient stated friend will come and stay with her throughout the day and overnight on Sunday. Pt's friend to transport patient to facility on Monday, 7/10 morning.   Bariatric bed delivery scheduled to [facility]. Patient to arrive at facility around [place].   D/C summary and all other documents faxed via [place] to [facility].   RNCM notified to arrange EMS transportation. MD notified of above.   Clinical Social Worker will sign off for now as social work intervention is no longer needed. Please consult [place] again if new need arises.  Glendon Axe, MSW, LCSWA [phone] 12/16/2015 3:36 PM

## 2015-12-19 ENCOUNTER — Encounter: Payer: Self-pay | Admitting: Internal Medicine

## 2015-12-19 ENCOUNTER — Non-Acute Institutional Stay (SKILLED_NURSING_FACILITY): Payer: Medicare Other | Admitting: Internal Medicine

## 2015-12-19 DIAGNOSIS — I82431 Acute embolism and thrombosis of right popliteal vein: Secondary | ICD-10-CM

## 2015-12-19 DIAGNOSIS — M6249 Contracture of muscle, multiple sites: Secondary | ICD-10-CM

## 2015-12-19 DIAGNOSIS — M5431 Sciatica, right side: Secondary | ICD-10-CM | POA: Diagnosis not present

## 2015-12-19 DIAGNOSIS — N3281 Overactive bladder: Secondary | ICD-10-CM

## 2015-12-19 DIAGNOSIS — I2699 Other pulmonary embolism without acute cor pulmonale: Secondary | ICD-10-CM | POA: Diagnosis not present

## 2015-12-19 DIAGNOSIS — M62838 Other muscle spasm: Secondary | ICD-10-CM

## 2015-12-19 NOTE — Progress Notes (Addendum)
MRN: [id number] Name: Ana Jones  Sex: female Age: 65 y.o. DOB: 1950/09/29  [place] #:  Facility/Room: [facility] / [facility] P Level Of Care: SNF Provider: Noah Delaine. Sheppard Coil, MD Emergency Contacts: Extended Emergency Contact Information Primary Emergency Contact: Lawrence Santiago of [place] Phone: [phone] Relation: Son Secondary Emergency Contact: Jamas Lav States of [place] Phone: [phone] Relation: Daughter  Code Status: Full Code  Allergies: Contrast media; Sulfamethoxazole; and Sulfa antibiotics  Chief Complaint  Patient presents with  . New Admit To SNF    Admit to Facility    HPI: Patient is 65 y.o. female with history of acute myelomonocytic leukemia in 2014 in remission, post-polio syndrome, RUE DVT in 2014, obesity and obstructive sleep apnea presented to [place] [facility] with 10 day history of progressive dyspnea on exertion with severe limitation of daily activities. She was tachycardic, SpO2 93% on RA but otherwise hemodynamically stable. Pt was admitted to [facility] from 7/6-8 where she was diagnosed with a DVT in the right popliteal vein that appears subacute to chronic in nature, likely developed during convalescence from R hip arthroscopy 2 months ago. V/Q scan showed large right upper lobe perfusion defect - high probability for pulmonary embolism. Admission labs were notable for PLTs 127, Bicarb 20, BNP 529, troponin I 0.04 and echocardiography showed moderate RV dilation consistent with RV strain Pt was started on lovenox as v]bridge to  Coumadin. Pt was d/c to home then came to SNF on 7/10 when a bariatric bed became available for OT/PT. While at SNF pt will be followed for sciatical, tx with topamax, muscle spasm, tx with flexeril, and OAB, tx with detrol LA.  Past Medical History  Diagnosis Date  . Arthritis   . Polio   . Leukemia ([place])   . History of DVT (deep vein thrombosis)   . Shortness of breath dyspnea 12/2015  . Sleep apnea   .  Overactive bladder     Past Surgical History  Procedure Laterality Date  . Knee surgery    . Abdominal hysterectomy    . Back surgery        Medication List       This list is accurate as of: 12/19/15 11:59 PM.  Always use your most recent med list.               cyclobenzaprine 10 MG tablet  Commonly known as:  FLEXERIL  Take 10 mg by mouth 3 (three) times daily as needed for muscle spasms.     enoxaparin 300 MG/3ML Soln injection  Commonly known as:  LOVENOX  Inject 1.66 mLs (166 mg total) into the skin every 12 (twelve) hours.     tolterodine 2 MG 24 hr capsule  Commonly known as:  DETROL LA  Take 2 mg by mouth daily.     topiramate 50 MG tablet  Commonly known as:  TOPAMAX  Take 50 mg by mouth at bedtime.     traMADol 50 MG tablet  Commonly known as:  ULTRAM  Take 1 tablet (50 mg total) by mouth every 6 (six) hours as needed for moderate pain.     warfarin 7.5 MG tablet  Commonly known as:  COUMADIN  Take 1 tablet (7.5 mg total) by mouth daily at 6 PM.        No orders of the defined types were placed in this encounter.     There is no immunization history on file for this patient.  Social History  Substance  Use Topics  . Smoking status: Former Smoker    Types: Cigarettes    Quit date: 05/15/2012  . Smokeless tobacco: Never Used     Comment: 40 pack year history  . Alcohol Use: 0.0 oz/week    0 Standard drinks or equivalent per week     Comment: rarely    Family history is   Family History  Problem Relation Age of Onset  . Heart failure Mother   . Heart disease Mother   . Alzheimer's disease Mother   . Kidney disease Father   . Pulmonary embolism Brother   . CVA Brother       Review of Systems  DATA OBTAINED: from patient GENERAL:  no fevers, fatigue, appetite changes SKIN: No itching, rash or wounds EYES: No eye pain, redness, discharge EARS: No earache, tinnitus, change in hearing NOSE: No congestion, drainage or bleeding   MOUTH/THROAT: No mouth or tooth pain, No sore throat RESPIRATORY: No cough, wheezing, SOB CARDIAC: No chest pain, palpitations, lower extremity edema  GI: No abdominal pain, No N/V/D or constipation, No heartburn or reflux  GU: No dysuria, frequency or urgency, or incontinence  MUSCULOSKELETAL: No unrelieved bone/joint pain; has HS muscle spasms NEUROLOGIC: No headache, dizziness or focal weakness PSYCHIATRIC: No c/o anxiety or sadness   Filed Vitals:   12/19/15 0914  BP: 147/83  Pulse: 91  Temp: 98.3 F (36.8 C)  Resp: 18    SpO2 Readings from Last 1 Encounters:  12/19/15 95%        Physical Exam  GENERAL APPEARANCE: Alert, conversant,  No acute distress., obese WF SKIN: No diaphoresis rash HEAD: Normocephalic, atraumatic  EYES: Conjunctiva/lids clear. Pupils round, reactive. EOMs intact.  EARS: External exam WNL, canals clear. Hearing grossly normal.  NOSE: No deformity or discharge.  MOUTH/THROAT: Lips w/o lesions  RESPIRATORY: Breathing is even, unlabored. Lung sounds are clear   CARDIOVASCULAR: Heart RRR no murmurs, rubs or gallops. No peripheral edema.   GASTROINTESTINAL: Abdomen is soft, non-tender, not distended w/ normal bowel sounds. GENITOURINARY: Bladder non tender, not distended  MUSCULOSKELETAL: No abnormal joints or musculature NEUROLOGIC:  Cranial nerves 2-12 grossly intact. Moves all extremities  PSYCHIATRIC: Mood and affect appropriate to situation, no behavioral issues  Patient Active Problem List   Diagnosis Date Noted  . Night muscle spasms 12/30/2015  . Overactive bladder 12/30/2015  . Chronic sciatica of right side 12/30/2015  . Dyspnea 12/14/2015  . 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[place]) 12/14/2015  . Obesity 12/14/2015  . History of myeloid leukemia 12/14/2015  . History of DVT (deep vein thrombosis) 12/14/2015  . PE (pulmonary thromboembolism) ([place]) 12/14/2015       Component Value Date/Time   WBC 6.8 12/15/2015 0432   WBC  6.8 12/15/2015   RBC 4.40 12/15/2015 0432   RBC 2.63* 08/10/2012 0937   HGB 14.2 12/15/2015 0432   HGB 8.4* 08/10/2012 0937   HCT 44.3 12/15/2015 0432   HCT 25.1* 08/10/2012 0937   PLT 132* 12/15/2015 0432   PLT <6* 08/10/2012 0937   MCV 100.7* 12/15/2015 0432   MCV 95 08/10/2012 0937   LYMPHSABS 2.7 12/14/2015 0254   LYMPHSABS 1.0 08/10/2012 0937   MONOABS 0.7 12/14/2015 0254   EOSABS 0.2 12/14/2015 0254   EOSABS 0.0 08/10/2012 0937   BASOSABS 0.0 12/14/2015 0254   BASOSABS 0.0 08/10/2012 0937        Component Value Date/Time   NA 141 12/15/2015 0432   NA 141 12/15/2015   K 3.9  12/15/2015 0432   CL 115* 12/15/2015 0432   CO2 20* 12/15/2015 0432   GLUCOSE 98 12/15/2015 0432   BUN 11 12/15/2015 0432   BUN 11 12/15/2015   CREATININE 0.96 12/15/2015 0432   CREATININE 1.0 12/15/2015   CALCIUM 8.3* 12/15/2015 0432   PROT 6.2 08/10/2012 0937   ALBUMIN 3.8 08/10/2012 0937   AST 8 08/10/2012 0937   ALT 11 08/10/2012 0937   ALKPHOS 99 08/10/2012 0937   BILITOT 0.6 08/10/2012 0937   GFRNONAA >60 12/15/2015 0432   GFRAA >60 12/15/2015 0432    No results found for: HGBA1C  No results found for: CHOL, HDL, LDLCALC, LDLDIRECT, TRIG, CHOLHDL   Dg Chest 2 View  12/14/2015  CLINICAL DATA:  Shortness of breath.  Right-sided chest pain. EXAM: CHEST  2 VIEW COMPARISON:  08/03/2014 FINDINGS: Lung volumes are low. The heart is enlarged, may be accentuated by technique. Mediastinal contours are unchanged. Calcified granuloma in the right upper lung. No consolidation to suggest pneumonia. No large pleural effusion or pneumothorax. No pulmonary edema. Detailed evaluation limited by soft tissue attenuation from body habitus. IMPRESSION: Enlargement of the cardiac silhouette, may be accentuated by low lung volumes. No localizing pulmonary process. Electronically Signed   By: Jeb Levering M.D.   On: 12/14/2015 03:32   Nm Pulmonary Perf And Vent  12/14/2015  CLINICAL DATA:  Shortness of  breath worsening dyspnea EXAM: NUCLEAR MEDICINE VENTILATION - PERFUSION LUNG SCAN TECHNIQUE: Ventilation images were obtained in multiple projections using inhaled aerosol Tc-63m DTPA. Perfusion images were obtained in multiple projections after intravenous injection of Tc-7m MAA. RADIOPHARMACEUTICALS:  Thirty-two MCi Technetium-3m DTPA aerosol inhalation and 4.3 mCi Technetium-29m MAA IV COMPARISON:  Chest x-ray 12/14/2015 FINDINGS: Ventilation: No focal ventilation defect. Perfusion: There is decreased perfusion with a large defect in right upper lobe. Chest x-ray shows no acute infiltrate or pulmonary edema. Findings are consistent with high probability for pulmonary embolus. IMPRESSION: High probability for pulmonary embolus with a large perfusion defect in right upper lobe. These results were called by telephone at the time of interpretation on 12/14/2015 at 11:04 am to Dr. Ripley Fraise , who verbally acknowledged these results. Electronically Signed   By: Lahoma Crocker M.D.   On: 12/14/2015 11:04    Not all labs, radiology exams or other studies done during hospitalization come through on my EPIC note; however they are reviewed by me.    Assessment and Plan  PE/DVT - DVT in the right popliteal vein that appears subacute to chronic in nature, likely developed during convalescence from R hip arthroscopy 2 months ago. V/Q scan showed large right upper lobe perfusion defect - high probability for pulmonary embolism. Admission labs were notable for PLTs 127, Bicarb 20, BNP 529, troponin I 0.04 and echocardiography showed moderate RV dilation consistent with RV strain. Patient does have personal history of right upper extremity DVT while she had active leukemia, and has 2 brothers with thromboembolic events in their [id number], which suggests she has genetic predisposition to thrombophilia (ie Factor V leiden, antithrombin or protein c/s deficiency, prothrombin mutation). Given this is the second DVT and  potential genetic predisposition, patient may require lifelong anticoagulation. Genetic thrombophilia can be considered for further evaluation in the outpatient setting. SNF - admitted for OT/PT; will cont lovenox until coumadin theraputic  MUSCLE SPASMS-  SNF - cont flexeril but changed to qHS when the problem happens  OVERATIVE BLADDER SNF- cont detrolLA 2 mg daily  [facility]AT[facility] SNF- stable;cont topamax 50 mg q  HS   Time spent > 35 min;> 50% of time with patient was spent reviewing records, labs, tests and studies, counseling and developing plan of care  Noah Delaine. Sheppard Coil, MD

## 2015-12-26 ENCOUNTER — Encounter: Payer: Self-pay | Admitting: Internal Medicine

## 2015-12-26 ENCOUNTER — Non-Acute Institutional Stay (SKILLED_NURSING_FACILITY): Payer: Medicare Other | Admitting: Internal Medicine

## 2015-12-26 DIAGNOSIS — M62838 Other muscle spasm: Secondary | ICD-10-CM

## 2015-12-26 DIAGNOSIS — E669 Obesity, unspecified: Secondary | ICD-10-CM | POA: Diagnosis not present

## 2015-12-26 DIAGNOSIS — I2699 Other pulmonary embolism without acute cor pulmonale: Secondary | ICD-10-CM | POA: Diagnosis not present

## 2015-12-26 DIAGNOSIS — I82431 Acute embolism and thrombosis of right popliteal vein: Secondary | ICD-10-CM

## 2015-12-26 DIAGNOSIS — M5431 Sciatica, right side: Secondary | ICD-10-CM

## 2015-12-26 DIAGNOSIS — M6249 Contracture of muscle, multiple sites: Secondary | ICD-10-CM

## 2015-12-26 DIAGNOSIS — Z86718 Personal history of other venous thrombosis and embolism: Secondary | ICD-10-CM | POA: Diagnosis not present

## 2015-12-26 DIAGNOSIS — Z856 Personal history of leukemia: Secondary | ICD-10-CM

## 2015-12-26 DIAGNOSIS — N3281 Overactive bladder: Secondary | ICD-10-CM | POA: Diagnosis not present

## 2015-12-26 NOTE — Progress Notes (Signed)
MRN: [id number] Name: Ana Jones  Sex: female Age: 65 y.o. DOB: 29-Mar-1951  [place] #:  Facility/Room: [facility] / [facility] P Level Of Care: SNF Provider: Noah Delaine. Sheppard Coil, MD Emergency Contacts: Extended Emergency Contact Information Primary Emergency Contact: Lawrence Santiago of [place] Phone: [phone] Relation: Son Secondary Emergency Contact: Jamas Lav States of [place] Phone: [phone] Relation: Daughter  Code Status: Full Code  Allergies: Contrast media; Sulfamethoxazole; and Sulfa antibiotics  Chief Complaint  Patient presents with  . Discharge Note    Discharged from SNF    HPI: Patient is 65 y.o. female with history of acute myelomonocytic leukemia in 2014 in remission, post-polio syndrome, RUE DVT in 2014, obesity and obstructive sleep apnea who was admitted to [facility] for recurrent DVT and new PE and then to SNF for OT/PT. Pt is now ready to be d/c to home.  Past Medical History  Diagnosis Date  . Arthritis   . Polio   . Leukemia ([place])   . History of DVT (deep vein thrombosis)   . Shortness of breath dyspnea 12/2015  . Sleep apnea   . Overactive bladder     Past Surgical History  Procedure Laterality Date  . Knee surgery    . Abdominal hysterectomy    . Back surgery        Medication List       This list is accurate as of: 12/26/15 11:59 PM.  Always use your most recent med list.               cyclobenzaprine 10 MG tablet  Commonly known as:  FLEXERIL  Take 10 mg by mouth 3 (three) times daily as needed for muscle spasms.     methocarbamol 750 MG tablet  Commonly known as:  ROBAXIN  Take 750 mg by mouth at bedtime.     tolterodine 2 MG 24 hr capsule  Commonly known as:  DETROL LA  Take 2 mg by mouth daily.     topiramate 50 MG tablet  Commonly known as:  TOPAMAX  Take 50 mg by mouth at bedtime.     traMADol 50 MG tablet  Commonly known as:  ULTRAM  Take 1 tablet (50 mg total) by mouth every 6 (six)  hours as needed for moderate pain.     triamcinolone cream 0.1 %  Commonly known as:  KENALOG  Apply 1 application topically 2 (two) times daily. Apply to right inner elbow     warfarin 5 MG tablet  Commonly known as:  COUMADIN  Take 5 mg by mouth daily at 6 PM.        Meds ordered this encounter  Medications  . methocarbamol (ROBAXIN) 750 MG tablet    Sig: Take 750 mg by mouth at bedtime.  . triamcinolone cream (KENALOG) 0.1 %    Sig: Apply 1 application topically 2 (two) times daily. Apply to right inner elbow  . warfarin (COUMADIN) 5 MG tablet    Sig: Take 5 mg by mouth daily at 6 PM.     There is no immunization history on file for this patient.  Social History  Substance Use Topics  . Smoking status: Former Smoker    Types: Cigarettes    Quit date: 05/15/2012  . Smokeless tobacco: Never Used     Comment: 40 pack year history  . Alcohol Use: 0.0 oz/week    0 Standard drinks or equivalent per week     Comment: rarely  Filed Vitals:   12/26/15 1133  Pulse: 91  Resp: 18    Physical Exam  GENERAL APPEARANCE: Alert, conversant. No acute distress.  HEENT: Unremarkable. RESPIRATORY: Breathing is even, unlabored. Lung sounds are clear   CARDIOVASCULAR: Heart RRR no murmurs, rubs or gallops. No peripheral edema.  GASTROINTESTINAL: Abdomen is soft, non-tender, not distended w/ normal bowel sounds.  NEUROLOGIC: Cranial nerves 2-12 grossly intact. Moves all extremities  Patient Active Problem List   Diagnosis Date Noted  . Night muscle spasms 12/30/2015  . Overactive bladder 12/30/2015  . Chronic sciatica of right side 12/30/2015  . Dyspnea 12/14/2015  . 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[place]) 12/14/2015  . Obesity 12/14/2015  . History of myeloid leukemia 12/14/2015  . History of DVT (deep vein thrombosis) 12/14/2015  . PE (pulmonary thromboembolism) ([place]) 12/14/2015    CBC    Component Value Date/Time   WBC 6.8 12/15/2015 0432   WBC 6.8  12/15/2015   RBC 4.40 12/15/2015 0432   RBC 2.63* 08/10/2012 0937   HGB 14.2 12/15/2015 0432   HGB 8.4* 08/10/2012 0937   HCT 44.3 12/15/2015 0432   HCT 25.1* 08/10/2012 0937   PLT 132* 12/15/2015 0432   PLT <6* 08/10/2012 0937   MCV 100.7* 12/15/2015 0432   MCV 95 08/10/2012 0937   LYMPHSABS 2.7 12/14/2015 0254   LYMPHSABS 1.0 08/10/2012 0937   MONOABS 0.7 12/14/2015 0254   EOSABS 0.2 12/14/2015 0254   EOSABS 0.0 08/10/2012 0937   BASOSABS 0.0 12/14/2015 0254   BASOSABS 0.0 08/10/2012 0937    CMP     Component Value Date/Time   NA 141 12/15/2015 0432   NA 141 12/15/2015   K 3.9 12/15/2015 0432   CL 115* 12/15/2015 0432   CO2 20* 12/15/2015 0432   GLUCOSE 98 12/15/2015 0432   BUN 11 12/15/2015 0432   BUN 11 12/15/2015   CREATININE 0.96 12/15/2015 0432   CREATININE 1.0 12/15/2015   CALCIUM 8.3* 12/15/2015 0432   PROT 6.2 08/10/2012 0937   ALBUMIN 3.8 08/10/2012 0937   AST 8 08/10/2012 0937   ALT 11 08/10/2012 0937   ALKPHOS 99 08/10/2012 0937   BILITOT 0.6 08/10/2012 0937   GFRNONAA >60 12/15/2015 0432   GFRAA >60 12/15/2015 0432    Assessment and Plan  Pt is d/c to home with HH/PT/Nursing. Medications have been reconciled and rx's written.   Time spent > 30 min;> 50% of time with patient was spent reviewing records, labs, tests and studies, counseling and developing plan of care  Noah Delaine. Sheppard Coil, MD

## 2015-12-28 ENCOUNTER — Non-Acute Institutional Stay (SKILLED_NURSING_FACILITY): Payer: Medicare Other | Admitting: Internal Medicine

## 2015-12-28 ENCOUNTER — Encounter: Payer: Self-pay | Admitting: Internal Medicine

## 2015-12-28 DIAGNOSIS — Z7901 Long term (current) use of anticoagulants: Secondary | ICD-10-CM

## 2015-12-28 DIAGNOSIS — I2699 Other pulmonary embolism without acute cor pulmonale: Secondary | ICD-10-CM | POA: Diagnosis not present

## 2015-12-28 DIAGNOSIS — L03115 Cellulitis of right lower limb: Secondary | ICD-10-CM | POA: Diagnosis not present

## 2015-12-28 DIAGNOSIS — Z86718 Personal history of other venous thrombosis and embolism: Secondary | ICD-10-CM

## 2015-12-28 NOTE — Progress Notes (Signed)
Location:   [facility] Room Number: 102/P Place of Service:  SNF [phone]) Provider:  Kennon Portela, MD  Patient Care Team: Daphene Calamity, MD as PCP - General (Family Medicine)  Extended Emergency Contact Information Primary Emergency Contact: Lawrence Santiago of [place] Phone: [phone] Relation: Son Secondary Emergency Contact: [place] of [place] Phone: [phone] Relation: Daughter  Code Status:  Full Code Goals of care: Advanced Directive information Advanced Directives 12/28/2015  Does patient have an advance directive? Yes  Type of Advance Directive (No Data)  Does patient want to make changes to advanced directive? No - Patient declined  Copy of advanced directive(s) in chart? Yes     Chief Complaint  Patient presents with  . Acute Visit    Has rash on right lower leg   Also follow-up of Coumadin management with history of recurrent DVT and new PE HPI:  Pt is a 65 y.o. female seen today for an acute visit forFollow-up of a rash question cellulitis right lower leg. She was seen for discharge 2 days ago by Dr. Sheppard Coil and started on doxycycline secondary to concerns of developing cellulitis on her right lower leg.  I am also seeing her for anticoagulation management INR is slightly elevated at just over 3-she is on Coumadin 5 mg a day will reduce this to 4 mg a day and have this rechecked tomorrow.  Really patient has no acute complaints she is resting in bed appear somewhat somnolent apparently she is sleepy at this time a day per nursing and this is not new.  According in nursing the area of erythema on her right lower leg appears to be improved.  Apparently a little more beefy red this morning but later in the day apparently this began to improve somewhat nursing tells me.  Regards to Coumadin management there's been no increased bruising or bleeding she does not complain of  any shortness of breath or significant leg pain at this time.     Past Medical History  Diagnosis Date  . Arthritis   . Polio   . Leukemia ([place])   . History of DVT (deep vein thrombosis)   . Shortness of breath dyspnea 12/2015  . Sleep apnea   . Overactive bladder    Past Surgical History  Procedure Laterality Date  . Knee surgery    . Abdominal hysterectomy    . Back surgery      Allergies  Allergen Reactions  . Contrast Media [Iodinated Diagnostic Agents] Anaphylaxis  . Sulfamethoxazole Itching and Swelling  . Sulfa Antibiotics Rash      Medication List       This list is accurate as of: 12/28/15  2:46 PM.  Always use your most recent med list.               cyclobenzaprine 10 MG tablet  Commonly known as:  FLEXERIL  Take 10 mg by mouth at bedtime.     cyclobenzaprine 10 MG tablet  Commonly known as:  FLEXERIL  Take 10 mg by mouth 3 (three) times daily as needed for muscle spasms.     diphenhydrAMINE 25 mg capsule  Commonly known as:  BENADRYL  Take 25 mg by mouth every 6 (six) hours as needed.     doxycycline 100 MG EC tablet  Commonly known as:  DORYX  Give 1 tablet by mouth daily. STOP DATE 01/02/16     furosemide 40 MG tablet  Commonly known as:  LASIX  Give 1 tablet by mouth every Am M,W,F     methocarbamol 750 MG tablet  Commonly known as:  ROBAXIN  Take 750 mg by mouth at bedtime.     tolterodine 2 MG 24 hr capsule  Commonly known as:  DETROL LA  Take 2 mg by mouth daily.     topiramate 50 MG tablet  Commonly known as:  TOPAMAX  Take 50 mg by mouth at bedtime.     traMADol 50 MG tablet  Commonly known as:  ULTRAM  Take 1 tablet (50 mg total) by mouth every 6 (six) hours as needed for moderate pain.     triamcinolone cream 0.1 %  Commonly known as:  KENALOG  Apply 1 application topically 2 (two) times daily. Apply to right inner elbow     warfarin 5 MG tablet  Commonly known as:  COUMADIN  Take 5 mg by mouth daily at 6 PM.         Review of Systems   In general is not complaining of any fever or chills.  Skin again does have erythema on her right lower leg this appears to be improving according to nursing.  Head ears eyes nose mouth and throat does not complaining of any visual changes or sore throat.  Respiratory is not complaining of increased shortness breath or cough.  Cardiac does not complaining of chest pain has apparently chronic lower extremity edema.  GI is not complaining of nausea vomiting diarrhea constipation or abdominal discomfort.  Must skeletal skeletal is not complaining specifically of joint pain at this point.  Neurologic is not complaining of dizziness or headache or syncopal-type feelings.  Psych is not complaining of anxiety or depression at this time again she is somewhat somnolent but easily arousable and conversant    There is no immunization history on file for this patient. Pertinent  Health Maintenance Due  Topic Date Due  . PAP SMEAR  04/22/1973  . MAMMOGRAM  04/22/2002  . COLONOSCOPY  04/22/2002  . INFLUENZA VACCINE  01/09/2016   No flowsheet data found. Functional Status Survey:    Filed Vitals:   12/28/15 1423  BP: 100/60  Pulse: 88  Temp: 97 F (36.1 C)  TempSrc: Oral  Resp: 18  Height: 5\' 3"  (1.6 m)  Weight: 383 lb 12.8 oz (174.091 kg)   Body mass index is 68 kg/(m^2). Physical Exam   GENERAL APPEARANCE: Alert, conversant. No acute distress. Was sleeping but easily arousable HEENT: Unremarkable. RESPIRATORY: Breathing is even, unlabored. Lung sounds are clear   CARDIOVASCULAR: Heart RRR no murmurs, rubs or gallops. 1+ peripheral edema pedal pulses are intact bilaterally edema appears to be cool to touch nonerythematous except in the area on the right shin as noted below  GASTROINTESTINAL: Abdomen is soft  obese, non-tender, not distended w/ normal bowel sounds.  Muscle skeletal moves all extremities 4 limited exam since she is in bed I do not  note any deformities other than arthritic  NEUROLOGIC: Cranial nerves 2-12 grossly intact. Moves all extremities  Psych she is alert and oriented pleasant and appropriate.  Skin-I do note right shin area there is some pale erythema anterior portion there is no drainage or bleeding there is slight warmth according to nursing this has improved from  earlier today   Labs reviewed:  Recent Labs  12/14/15 12/14/15 0254 12/15/15 12/15/15 0432  NA 140 140 141 141  K 4.3 4.3  --  3.9  CL  --  112*  --  115*  CO2  --  20*  --  20*  GLUCOSE  --  105*  --  98  BUN 20 20 11 11   CREATININE 1.1 1.12* 1.0 0.96  CALCIUM  --  8.7*  --  8.3*   No results for input(s): AST, ALT, ALKPHOS, BILITOT, PROT, ALBUMIN in the last 8760 hours.  Recent Labs  12/14/15 12/14/15 0254 12/15/15 12/15/15 0432  WBC 8.3 8.3 6.8 6.8  NEUTROABS  --  4.7  --   --   HGB 14.9 14.9  --  14.2  HCT 45 44.9  --  44.3  MCV  --  100.4*  --  100.7*  PLT 127* 127*  --  132*   Lab Results  Component Value Date   TSH 1.240 12/15/2015   No results found for: HGBA1C No results found for: CHOL, HDL, LDLCALC, LDLDIRECT, TRIG, CHOLHDL  Significant Diagnostic Results in last 30 days:  Dg Chest 2 View  12/14/2015  CLINICAL DATA:  Shortness of breath.  Right-sided chest pain. EXAM: CHEST  2 VIEW COMPARISON:  08/03/2014 FINDINGS: Lung volumes are low. The heart is enlarged, may be accentuated by technique. Mediastinal contours are unchanged. Calcified granuloma in the right upper lung. No consolidation to suggest pneumonia. No large pleural effusion or pneumothorax. No pulmonary edema. Detailed evaluation limited by soft tissue attenuation from body habitus. IMPRESSION: Enlargement of the cardiac silhouette, may be accentuated by low lung volumes. No localizing pulmonary process. Electronically Signed   By: Jeb Levering M.D.   On: 12/14/2015 03:32   Nm Pulmonary Perf And Vent  12/14/2015  CLINICAL DATA:  Shortness of breath  worsening dyspnea EXAM: NUCLEAR MEDICINE VENTILATION - PERFUSION LUNG SCAN TECHNIQUE: Ventilation images were obtained in multiple projections using inhaled aerosol Tc-82m DTPA. Perfusion images were obtained in multiple projections after intravenous injection of Tc-37m MAA. RADIOPHARMACEUTICALS:  Thirty-two MCi Technetium-47m DTPA aerosol inhalation and 4.3 mCi Technetium-13m MAA IV COMPARISON:  Chest x-ray 12/14/2015 FINDINGS: Ventilation: No focal ventilation defect. Perfusion: There is decreased perfusion with a large defect in right upper lobe. Chest x-ray shows no acute infiltrate or pulmonary edema. Findings are consistent with high probability for pulmonary embolus. IMPRESSION: High probability for pulmonary embolus with a large perfusion defect in right upper lobe. These results were called by telephone at the time of interpretation on 12/14/2015 at 11:04 am to Dr. Ripley Fraise , who verbally acknowledged these results. Electronically Signed   By: Lahoma Crocker M.D.   On: 12/14/2015 11:04    Assessment/Plan  #1 question lower extremity cellulitis-apparently this is improving as noted above she has been started on doxycycline would continue this and monitor will need monitoring when she is discharged as well.  Clinically her vital signs are stable.  #2 history of anticoagulation on chronic Coumadin with history of recurrent DVT and recent pulmonary embolism-clinically she appears stable in this regard does not complain of any chest pain or shortness of breath vital signs are stable-INR is slightly supratherapeutic today Will decrease her Coumadin down to 4 mg a day and haven't PT/INR checked tomorrow and dose accordingly.  As an outpatient  will need to be followed closely secondary to being on antibiotic.  [id number] note greater than 25 minutes spent assessing patient reviewing her chart-reviewing her labs-discussing her status with nursing staff-and  coordinating plan of care-of note greater  than 50% of time spent coordinating and formulating a plan of care with input as noted above  Oralia Manis, [place]

## 2015-12-30 DIAGNOSIS — N3281 Overactive bladder: Secondary | ICD-10-CM | POA: Insufficient documentation

## 2015-12-30 DIAGNOSIS — M62838 Other muscle spasm: Secondary | ICD-10-CM | POA: Insufficient documentation

## 2015-12-30 DIAGNOSIS — M5431 Sciatica, right side: Secondary | ICD-10-CM | POA: Insufficient documentation

## 2016-02-20 ENCOUNTER — Other Ambulatory Visit: Payer: Self-pay | Admitting: Internal Medicine

## 2016-04-12 ENCOUNTER — Emergency Department (HOSPITAL_COMMUNITY)
Admission: EM | Admit: 2016-04-12 | Discharge: 2016-04-12 | Disposition: A | Discharge status: Home / Self Care | Payer: Medicare Other | Attending: Emergency Medicine | Admitting: Emergency Medicine

## 2016-04-12 ENCOUNTER — Emergency Department (HOSPITAL_COMMUNITY): Payer: Medicare Other

## 2016-04-12 DIAGNOSIS — Y999 Unspecified external cause status: Secondary | ICD-10-CM | POA: Diagnosis not present

## 2016-04-12 DIAGNOSIS — Y92000 Kitchen of unspecified non-institutional (private) residence as  the place of occurrence of the external cause: Secondary | ICD-10-CM | POA: Diagnosis not present

## 2016-04-12 DIAGNOSIS — Y939 Activity, unspecified: Secondary | ICD-10-CM | POA: Insufficient documentation

## 2016-04-12 DIAGNOSIS — W010XXA Fall on same level from slipping, tripping and stumbling without subsequent striking against object, initial encounter: Secondary | ICD-10-CM | POA: Insufficient documentation

## 2016-04-12 DIAGNOSIS — S52571A Other intraarticular fracture of lower end of right radius, initial encounter for closed fracture: Secondary | ICD-10-CM | POA: Insufficient documentation

## 2016-04-12 DIAGNOSIS — W19XXXA Unspecified fall, initial encounter: Secondary | ICD-10-CM

## 2016-04-12 DIAGNOSIS — Z7901 Long term (current) use of anticoagulants: Secondary | ICD-10-CM | POA: Diagnosis not present

## 2016-04-12 DIAGNOSIS — Z87891 Personal history of nicotine dependence: Secondary | ICD-10-CM | POA: Insufficient documentation

## 2016-04-12 DIAGNOSIS — S6991XA Unspecified injury of right wrist, hand and finger(s), initial encounter: Secondary | ICD-10-CM | POA: Diagnosis present

## 2016-04-12 MED ORDER — OXYCODONE HCL 5 MG PO TABS: 10.0000 mg | ORAL_TABLET | Freq: Once | ORAL | Status: DC

## 2016-04-12 MED ORDER — MORPHINE SULFATE (PF) 4 MG/ML IV SOLN
4.0000 mg | Freq: Once | INTRAVENOUS | Status: AC
  Administered 2016-04-12: 4 mg via INTRAVENOUS
  Filled 2016-04-12: qty 1

## 2016-04-12 NOTE — ED Provider Notes (Signed)
[facility] DEPT Provider Note   CSN: [id number] Arrival date & time: 04/12/16  1410     History   Chief Complaint Chief Complaint  Patient presents with  . Fall    HPI Ana Jones is a 65 y.o. female presents to the ED by EMS after she had a mechanical fall. She ambulates with a walker at baseline and was throwing away some trash in her kitchen, when doing so caused her to push her walker aside slightly, causing her to lose her balance and fall in a Dawn mechanism on her right wrist. She noted immediate pain and deformity of her wrist. She denies hitting her head, LOC, or any other significant injury. She is on coumadin for prior DVT.   HPI  Past Medical History:  Diagnosis Date  . Arthritis   . History of DVT (deep vein thrombosis)   . Leukemia ([place])   . Overactive bladder   . Polio   . Shortness of breath dyspnea 12/2015  . Sleep apnea     Patient Active Problem List   Diagnosis Date Noted  . Night muscle spasms 12/30/2015  . Overactive bladder 12/30/2015  . Chronic sciatica of right side 12/30/2015  . Dyspnea 12/14/2015  . 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[place]) 12/14/2015  . Obesity 12/14/2015  . History of myeloid leukemia 12/14/2015  . History of DVT (deep vein thrombosis) 12/14/2015  . PE (pulmonary thromboembolism) ([place]) 12/14/2015    Past Surgical History:  Procedure Laterality Date  . ABDOMINAL HYSTERECTOMY    . BACK SURGERY    . KNEE SURGERY      OB History    No data available       Home Medications    Prior to Admission medications   Medication Sig Start Date End Date Taking? Authorizing Provider  cyclobenzaprine (FLEXERIL) 10 MG tablet Take 10 mg by mouth at bedtime.   Yes Historical Provider, MD  diphenhydrAMINE (BENADRYL) 25 mg capsule Take 25 mg by mouth every 6 (six) hours as needed for itching or allergies.    Yes Historical Provider, MD  methocarbamol (ROBAXIN) 750 MG tablet Take 750 mg by mouth at bedtime as needed for  spasms. 03/26/16  Yes Historical Provider, MD  mirabegron ER (MYRBETRIQ) 25 MG TB24 tablet Take 25 mg by mouth daily. 03/26/16  Yes Historical Provider, MD  topiramate (TOPAMAX) 50 MG tablet Take 50 mg by mouth at bedtime. 03/26/16  Yes Historical Provider, MD  traMADol (ULTRAM-ER) 100 MG 24 hr tablet Take 100 mg by mouth at bedtime.    Yes Historical Provider, MD  warfarin (COUMADIN) 4 MG tablet Take 4 mg by mouth every evening. 03/26/16  Yes Historical Provider, MD  traMADol (ULTRAM) 50 MG tablet Take 1 tablet (50 mg total) by mouth every 6 (six) hours as needed for moderate pain. Patient not taking: Reported on 04/12/2016 12/15/15   Milagros Loll, MD    Family History Family History  Problem Relation Age of Onset  . Heart failure Mother   . Heart disease Mother   . Alzheimer's disease Mother   . Kidney disease Father   . Pulmonary embolism Brother   . CVA Brother     Social History Social History  Substance Use Topics  . Smoking status: Former Smoker    Types: Cigarettes    Quit date: 05/15/2012  . Smokeless tobacco: Never Used     Comment: 40 pack year history  . Alcohol use 0.0 oz/week     Comment:  rarely     Allergies   Contrast media [iodinated diagnostic agents]; Iodides; Sulfa antibiotics; and Sulfamethoxazole   Review of Systems Review of Systems  Constitutional: Positive for activity change. Negative for chills and fever.  Respiratory: Negative for cough, chest tightness and shortness of breath.   Cardiovascular: Negative for chest pain.  Gastrointestinal: Negative for abdominal pain, nausea and vomiting.  Genitourinary: Negative for flank pain and pelvic pain.  Musculoskeletal: Positive for arthralgias (right wrist), gait problem and joint swelling. Negative for back pain and neck pain.  Skin: Negative for wound.  Neurological: Negative for syncope, weakness and numbness.  All other systems reviewed and are negative.    Physical Exam Updated Vital  Signs BP 123/77   Pulse 78   Ht 5\' 5"  (1.651 m)   Wt (!) 161 kg   SpO2 99%   BMI 59.08 kg/m   Physical Exam  Constitutional: She is oriented to person, place, and time. She appears well-developed and well-nourished. No distress.  HENT:  Head: Normocephalic and atraumatic.  Nose: Nose normal.  Mouth/Throat: Oropharynx is clear and moist.  Eyes: Conjunctivae and EOM are normal. Pupils are equal, round, and reactive to light.  Neck: Neck supple.  Cardiovascular: Normal rate, regular rhythm, normal heart sounds and intact distal pulses.   Pulmonary/Chest: Effort normal and breath sounds normal.  Abdominal: Soft. She exhibits no distension. There is no tenderness.  Musculoskeletal: She exhibits edema and tenderness.       Right wrist: She exhibits decreased range of motion, tenderness, bony tenderness, swelling and crepitus. She exhibits no laceration.       Arms: Pelvis stable and nontender, no C/T/L spine tenderness  Neurological: She is alert and oriented to person, place, and time. She has normal strength. No cranial nerve deficit or sensory deficit. Coordination normal. GCS eye subscore is 4. GCS verbal subscore is 5. GCS motor subscore is 6.  Skin: Skin is warm and dry. She is not diaphoretic.  Nursing note and vitals reviewed.    ED Treatments / Results  Labs (all labs ordered are listed, but only abnormal results are displayed) Labs Reviewed - No data to display  EKG  EKG Interpretation None       Radiology Dg Elbow 2 Views Right  Result Date: 04/12/2016 CLINICAL DATA:  Fall, right wrist pain EXAM: RIGHT ELBOW - 2 VIEW COMPARISON:  None. FINDINGS: No fracture or dislocation is seen. The joint spaces are preserved. No displaced elbow joint fat pads to suggest an elbow joint effusion. IMPRESSION: No fracture or dislocation is seen. Electronically Signed   By: Julian Hy M.D.   On: 04/12/2016 15:27   Dg Wrist Complete Right  Result Date: 04/12/2016 CLINICAL  DATA:  Pain following fall EXAM: RIGHT WRIST - COMPLETE 3+ VIEW COMPARISON:  None. FINDINGS: Frontal, oblique, lateral, and ulnar deviation scaphoid images were obtained. There is a comminuted fracture through the distal radial metaphysis with multiple displaced fracture fragments. Fracture fragments extend into the radiocarpal joint with separation of fracture fragments at the radiocarpal joint level. Distal fracture fragments are dorsally angulated and slightly dorsally displaced. There is mild widening of the space between the scaphoid and lunate suggesting scapholunate disassociation. No other fractures are evident. No frank dislocation. There is osteoarthritic change in the scaphotrapezial and first carpal -metacarpal joints. IMPRESSION: Comminuted fracture distal radial metaphysis with displaced fracture fragments. Question scapholunate disassociation. No frank dislocation. Areas of arthropathy laterally. Electronically Signed   By: Lowella Grip III M.D.  On: 04/12/2016 15:26   Dg Hand 2 View Right  Result Date: 04/12/2016 CLINICAL DATA:  Fall, right wrist pain EXAM: RIGHT HAND - 2 VIEW COMPARISON:  None. FINDINGS: Mildly displaced, comminuted intra-articular distal radial fracture. Mild widening of the scapholunate joint. Degenerative changes of the 1st carpometacarpal joint. Visualized soft tissues are grossly unremarkable. IMPRESSION: Mildly displaced, comminuted intra-articular distal radial fracture. Electronically Signed   By: Julian Hy M.D.   On: 04/12/2016 15:28    Procedures Procedures (including critical care time)  Medications Ordered in ED Medications  morphine 4 MG/ML injection 4 mg (4 mg Intravenous Given 04/12/16 1600)     Initial Impression / Assessment and Plan / ED Course  I have reviewed the triage vital signs and the nursing notes.  Pertinent labs & imaging results that were available during my care of the patient were reviewed by me and considered in my  medical decision making (see chart for details).  Clinical Course   65 y.o. female presents with a mechanical fall [place] on right wrist. Xrays were done and showed impacted, comminuted distal radius fracture. Social work was consulted to help facilitate home health arrangements for her with a wheelchair at home and PT/OT. Hand Surgery was consulted. While awaiting their recommendations her care was signed out to Dr. Martyn Ehrich at 4:00pm in the ED. Please see his note for further information regarding her splinting and disposition.   Final Clinical Impressions(s) / ED Diagnoses   Final diagnoses:  Other closed intra-articular fracture of distal end of right radius, initial encounter  Fall, initial encounter    New Prescriptions Discharge Medication List as of 04/12/2016  7:15 PM       Zenovia Jarred, DO 04/13/16 [id number]    Drenda Freeze, MD 04/15/16 1041

## 2016-04-12 NOTE — ED Notes (Signed)
Pt stable, understands discharge instructions, and reasons for return.   

## 2016-04-12 NOTE — Care Management Note (Signed)
Case Management Note  Patient Details  Name: Staci Doonan MRN: [id number] Date of Birth: 04/09/1951  Subjective/Objective:                  65 yo female in ED with fall.  From home alone.  Action/Plan: Follow for disposition needs. / Arrange home health services.  Expected Discharge Date:  04/12/16               Expected Discharge Plan:  [place]  In-House Referral:  NA  Discharge planning Services  CM Consult  Post Acute Care Choice:  Home Health Choice offered to:  Patient  DME Arranged:  N/A DME Agency:  NA  HH Arranged:  RN, PT, NA [place] Agency:  [facility] (now [facility])  Status of Service:  Completed, signed off  If discussed at [place] of Stay Meetings, dates discussed:    Additional Comments: Lorielle Boehning J. Clydene Laming, RN, BSN, [employer] [phone] Spoke with pt at bedside regarding discharge planning for [facility]. Offered pt list of home health agencies to choose from.  Pt chose [facility] to render services. Corliss Blacker, RN of [facility] notified.  No DME needs identified at this time.  Fuller Mandril, RN 04/12/2016, 3:41 PM

## 2016-04-12 NOTE — ED Notes (Signed)
Ok given to discharge home.  Escorted to vehicle at this time.

## 2016-04-12 NOTE — ED Triage Notes (Signed)
Patient stumbled and fell while placing items in the trash. States that she let go of her walker for a moment and when she turned to grab her walker she stumbled and fell.  She tried to break her fall with her right arm and injured her wrist.  200 mcg of fentanyl given by EMS.

## 2016-04-12 NOTE — Progress Notes (Signed)
Orthopedic Tech Progress Note Patient Details:  Ana Jones 12/30/1950 [id number]  Ortho Devices Type of Ortho Device: Sugartong splint Ortho Device/Splint Location: rue Ortho Device/Splint Interventions: Ordered, Application   Karolee Stamps 04/12/2016, 5:23 PM

## 2016-04-12 NOTE — ED Provider Notes (Signed)
Care assumed from Dr. Ronnald Ramp.   Fall today with walker (at baseline).   [place] and has right impacted distal radius with intra-articular extension.  Social work involved with home health care.  Will more than likely need sugar tong splint.  Splint applied by ortho tech  Plan to contact have hand surgery with close follow up.   Spoke with Dr. Lenon Curt who suggests patient schedule outpatient follow up.   Patient in agreement with plan and discharged home in good health.     Roberto Scales, MD 04/13/16 [id number]    Elnora Morrison, MD 04/19/16 [phone]

## 2016-04-13 NOTE — Progress Notes (Signed)
CM received call from CM, Alesia concerning a discharged pt from ED of [facility] who voiced a complaint (though the complaint was unknown to [place] from the [place] who called her).  This CM called the pt who was discharged from ED after 19:00 and gave pt contact numbers for [facility].  CM called [facility] who confirmed referral and was able to state [facility] on Monday; Tommi Emery had [place] call the pt to confirm Monday.  This CM made an additional follow up call and pt had already received Cindy's call; pt expressed appreciation for follow up call.  No other CM needs were communicated.

## 2016-04-25 ENCOUNTER — Encounter (HOSPITAL_COMMUNITY): Payer: Self-pay | Admitting: *Deleted

## 2016-04-25 ENCOUNTER — Other Ambulatory Visit: Payer: Self-pay | Admitting: Orthopedic Surgery

## 2016-04-25 NOTE — Progress Notes (Signed)
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

## 2016-04-26 ENCOUNTER — Encounter (HOSPITAL_COMMUNITY)
Admission: RE | Disposition: A | Discharge status: Home / Self Care | Payer: Self-pay | Source: Ambulatory Visit | Attending: Orthopedic Surgery

## 2016-04-26 ENCOUNTER — Ambulatory Visit (HOSPITAL_COMMUNITY): Payer: Medicare Other | Admitting: Anesthesiology

## 2016-04-26 ENCOUNTER — Ambulatory Visit (HOSPITAL_COMMUNITY)
Admission: RE | Admit: 2016-04-26 | Discharge: 2016-04-26 | Disposition: A | Discharge status: Home / Self Care | Payer: Medicare Other | Source: Ambulatory Visit | Attending: Orthopedic Surgery | Admitting: Orthopedic Surgery

## 2016-04-26 ENCOUNTER — Encounter (HOSPITAL_COMMUNITY): Payer: Self-pay | Admitting: Anesthesiology

## 2016-04-26 DIAGNOSIS — G4730 Sleep apnea, unspecified: Secondary | ICD-10-CM | POA: Insufficient documentation

## 2016-04-26 DIAGNOSIS — Z86718 Personal history of other venous thrombosis and embolism: Secondary | ICD-10-CM | POA: Insufficient documentation

## 2016-04-26 DIAGNOSIS — Z86711 Personal history of pulmonary embolism: Secondary | ICD-10-CM | POA: Insufficient documentation

## 2016-04-26 DIAGNOSIS — Z79899 Other long term (current) drug therapy: Secondary | ICD-10-CM | POA: Insufficient documentation

## 2016-04-26 DIAGNOSIS — B91 Sequelae of poliomyelitis: Secondary | ICD-10-CM | POA: Diagnosis not present

## 2016-04-26 DIAGNOSIS — Z7901 Long term (current) use of anticoagulants: Secondary | ICD-10-CM | POA: Diagnosis not present

## 2016-04-26 DIAGNOSIS — C9591 Leukemia, unspecified, in remission: Secondary | ICD-10-CM | POA: Insufficient documentation

## 2016-04-26 DIAGNOSIS — M792 Neuralgia and neuritis, unspecified: Secondary | ICD-10-CM | POA: Diagnosis not present

## 2016-04-26 DIAGNOSIS — S52571A Other intraarticular fracture of lower end of right radius, initial encounter for closed fracture: Secondary | ICD-10-CM | POA: Insufficient documentation

## 2016-04-26 DIAGNOSIS — Z6841 Body Mass Index (BMI) 40.0 and over, adult: Secondary | ICD-10-CM | POA: Insufficient documentation

## 2016-04-26 DIAGNOSIS — N3281 Overactive bladder: Secondary | ICD-10-CM | POA: Insufficient documentation

## 2016-04-26 DIAGNOSIS — Z87891 Personal history of nicotine dependence: Secondary | ICD-10-CM | POA: Diagnosis not present

## 2016-04-26 HISTORY — DX: Nausea with vomiting, unspecified: R11.2

## 2016-04-26 HISTORY — DX: Unspecified urinary incontinence: R32

## 2016-04-26 HISTORY — DX: Other complications of anesthesia, initial encounter: T88.59XA

## 2016-04-26 HISTORY — PX: OPEN REDUCTION INTERNAL FIXATION (ORIF) DISTAL RADIAL FRACTURE: SHX5989

## 2016-04-26 HISTORY — PX: CARPAL TUNNEL RELEASE: SHX101

## 2016-04-26 HISTORY — DX: Other specified postprocedural states: Z98.890

## 2016-04-26 HISTORY — DX: Adverse effect of unspecified anesthetic, initial encounter: T41.45XA

## 2016-04-26 HISTORY — DX: Neuralgia and neuritis, unspecified: M79.2

## 2016-04-26 HISTORY — DX: Other pulmonary embolism without acute cor pulmonale: I26.99

## 2016-04-26 HISTORY — DX: Personal history of other medical treatment: Z92.89

## 2016-04-26 LAB — CBC
HCT: 43.6 % (ref 36.0–46.0)
Hemoglobin: 14.4 g/dL (ref 12.0–15.0)
MCH: 32.1 pg (ref 26.0–34.0)
MCHC: 33 g/dL (ref 30.0–36.0)
MCV: 97.3 fL (ref 78.0–100.0)
Platelets: 226 10*3/uL (ref 150–400)
RBC: 4.48 MIL/uL (ref 3.87–5.11)
RDW: 14 % (ref 11.5–15.5)
WBC: 8.8 10*3/uL (ref 4.0–10.5)

## 2016-04-26 LAB — PROTIME-INR
INR: 1.71
Prothrombin Time: 20.3 seconds — ABNORMAL HIGH (ref 11.4–15.2)

## 2016-04-26 SURGERY — OPEN REDUCTION INTERNAL FIXATION (ORIF) DISTAL RADIUS FRACTURE
Anesthesia: Regional | Site: Wrist | Laterality: Right

## 2016-04-26 MED ORDER — ACETAMINOPHEN 325 MG PO TABS: 325.0000 mg | ORAL_TABLET | ORAL | Status: DC | PRN

## 2016-04-26 MED ORDER — MIDAZOLAM HCL 2 MG/2ML IJ SOLN
INTRAMUSCULAR | Status: AC
  Filled 2016-04-26: qty 2

## 2016-04-26 MED ORDER — CHLORHEXIDINE GLUCONATE 4 % EX LIQD: 60.0000 mL | Freq: Once | CUTANEOUS | Status: DC

## 2016-04-26 MED ORDER — MORPHINE SULFATE (PF) 4 MG/ML IV SOLN: 1.0000 mg | INTRAVENOUS | Status: DC | PRN

## 2016-04-26 MED ORDER — CEFAZOLIN SODIUM-DEXTROSE 2-4 GM/100ML-% IV SOLN
INTRAVENOUS | Status: AC
  Filled 2016-04-26: qty 100

## 2016-04-26 MED ORDER — ACETAMINOPHEN 160 MG/5ML PO SOLN
325.0000 mg | ORAL | Status: DC | PRN
  Filled 2016-04-26: qty 20.3

## 2016-04-26 MED ORDER — MIDAZOLAM HCL 2 MG/2ML IJ SOLN
INTRAMUSCULAR | Status: AC
  Administered 2016-04-26: 2 mg via INTRAVENOUS
  Filled 2016-04-26: qty 2

## 2016-04-26 MED ORDER — LIDOCAINE HCL (CARDIAC) 20 MG/ML IV SOLN
INTRAVENOUS | Status: DC | PRN
  Administered 2016-04-26: 100 mg via INTRAVENOUS

## 2016-04-26 MED ORDER — CEFAZOLIN SODIUM-DEXTROSE 2-4 GM/100ML-% IV SOLN
2.0000 g | INTRAVENOUS | Status: AC
  Administered 2016-04-26: 2 g via INTRAVENOUS

## 2016-04-26 MED ORDER — ONDANSETRON HCL 4 MG/2ML IJ SOLN
INTRAMUSCULAR | Status: DC | PRN
  Administered 2016-04-26: 4 mg via INTRAVENOUS

## 2016-04-26 MED ORDER — OXYCODONE HCL 5 MG/5ML PO SOLN: 5.0000 mg | Freq: Once | ORAL | Status: DC | PRN

## 2016-04-26 MED ORDER — PROPOFOL 10 MG/ML IV BOLUS
INTRAVENOUS | Status: DC | PRN
  Administered 2016-04-26: 200 mg via INTRAVENOUS

## 2016-04-26 MED ORDER — LACTATED RINGERS IV SOLN
INTRAVENOUS | Status: DC
  Administered 2016-04-26 (×2): via INTRAVENOUS

## 2016-04-26 MED ORDER — FENTANYL CITRATE (PF) 100 MCG/2ML IJ SOLN
INTRAMUSCULAR | Status: AC
  Filled 2016-04-26: qty 2

## 2016-04-26 MED ORDER — LIDOCAINE 2% (20 MG/ML) 5 ML SYRINGE
INTRAMUSCULAR | Status: AC
  Filled 2016-04-26: qty 5

## 2016-04-26 MED ORDER — FENTANYL CITRATE (PF) 100 MCG/2ML IJ SOLN
INTRAMUSCULAR | Status: AC
  Administered 2016-04-26: 100 ug via INTRAVENOUS
  Filled 2016-04-26: qty 2

## 2016-04-26 MED ORDER — BUPIVACAINE HCL (PF) 0.25 % IJ SOLN
INTRAMUSCULAR | Status: AC
  Filled 2016-04-26: qty 30

## 2016-04-26 MED ORDER — MEPERIDINE HCL 25 MG/ML IJ SOLN: 6.2500 mg | INTRAMUSCULAR | Status: DC | PRN

## 2016-04-26 MED ORDER — ONDANSETRON HCL 4 MG/2ML IJ SOLN
INTRAMUSCULAR | Status: AC
  Filled 2016-04-26: qty 2

## 2016-04-26 MED ORDER — OXYCODONE-ACETAMINOPHEN 5-325 MG PO TABS: 1.0000 | ORAL_TABLET | ORAL | 0 refills | Status: DC | PRN

## 2016-04-26 MED ORDER — MIDAZOLAM HCL 5 MG/5ML IJ SOLN
INTRAMUSCULAR | Status: DC | PRN
  Administered 2016-04-26: 2 mg via INTRAVENOUS

## 2016-04-26 MED ORDER — OXYCODONE HCL 5 MG PO TABS: 5.0000 mg | ORAL_TABLET | Freq: Once | ORAL | Status: DC | PRN

## 2016-04-26 MED ORDER — FENTANYL CITRATE (PF) 100 MCG/2ML IJ SOLN
100.0000 ug | Freq: Once | INTRAMUSCULAR | Status: AC
  Administered 2016-04-26: 100 ug via INTRAVENOUS

## 2016-04-26 MED ORDER — BUPIVACAINE HCL (PF) 0.25 % IJ SOLN
INTRAMUSCULAR | Status: DC | PRN
  Administered 2016-04-26: 10 mL

## 2016-04-26 MED ORDER — 0.9 % SODIUM CHLORIDE (POUR BTL) OPTIME
TOPICAL | Status: DC | PRN
  Administered 2016-04-26: 1000 mL

## 2016-04-26 MED ORDER — PROPOFOL 10 MG/ML IV BOLUS
INTRAVENOUS | Status: AC
  Filled 2016-04-26: qty 20

## 2016-04-26 MED ORDER — MIDAZOLAM HCL 2 MG/2ML IJ SOLN
2.0000 mg | Freq: Once | INTRAMUSCULAR | Status: AC
  Administered 2016-04-26: 2 mg via INTRAVENOUS

## 2016-04-26 MED ORDER — FENTANYL CITRATE (PF) 100 MCG/2ML IJ SOLN
INTRAMUSCULAR | Status: DC | PRN
  Administered 2016-04-26: 100 ug via INTRAVENOUS
  Administered 2016-04-26 (×2): 50 ug via INTRAVENOUS

## 2016-04-26 MED ORDER — CEFAZOLIN SODIUM-DEXTROSE 2-4 GM/100ML-% IV SOLN: 2.0000 g | INTRAVENOUS | Status: DC

## 2016-04-26 MED ORDER — FENTANYL CITRATE (PF) 100 MCG/2ML IJ SOLN
INTRAMUSCULAR | Status: AC
  Filled 2016-04-26: qty 4

## 2016-04-26 SURGICAL SUPPLY — 68 items
APL SKNCLS STERI-STRIP NONHPOA (GAUZE/BANDAGES/DRESSINGS) ×1
BANDAGE ACE 3X5.8 VEL STRL LF (GAUZE/BANDAGES/DRESSINGS) ×2 IMPLANT
BANDAGE ACE 4X5 VEL STRL LF (GAUZE/BANDAGES/DRESSINGS) ×2 IMPLANT
BANDAGE ELASTIC 3 VELCRO ST LF (GAUZE/BANDAGES/DRESSINGS) ×2 IMPLANT
BENZOIN TINCTURE PRP APPL 2/3 (GAUZE/BANDAGES/DRESSINGS) ×2 IMPLANT
BIT DRILL 1.7 (BIT) ×2 IMPLANT
BIT DRILL 2.5 CANN REUSE (DRILL) ×2 IMPLANT
BNDG CMPR 9X4 STRL LF SNTH (GAUZE/BANDAGES/DRESSINGS) ×1
BNDG ESMARK 4X9 LF (GAUZE/BANDAGES/DRESSINGS) ×2 IMPLANT
BNDG GAUZE ELAST 4 BULKY (GAUZE/BANDAGES/DRESSINGS) ×4 IMPLANT
CLSR STERI-STRIP ANTIMIC 1/2X4 (GAUZE/BANDAGES/DRESSINGS) ×2 IMPLANT
CORDS BIPOLAR (ELECTRODE) ×2 IMPLANT
CORTEX SCREW 2.4X22MM VAL NEAR (Screw) ×10 IMPLANT
COVER MAYO STAND STRL (DRAPES) IMPLANT
COVER SURGICAL LIGHT HANDLE (MISCELLANEOUS) ×2 IMPLANT
CUFF TOURNIQUET SINGLE 18IN (TOURNIQUET CUFF) ×2 IMPLANT
CUFF TOURNIQUET SINGLE 24IN (TOURNIQUET CUFF) IMPLANT
DRAPE C-ARM MINI 42X72 WSTRAPS (DRAPES) ×2 IMPLANT
DRAPE OEC MINIVIEW 54X84 (DRAPES) ×2 IMPLANT
DRAPE SURG 17X23 STRL (DRAPES) ×2 IMPLANT
DURAPREP 26ML APPLICATOR (WOUND CARE) ×2 IMPLANT
ELECT REM PT RETURN 9FT ADLT (ELECTROSURGICAL)
ELECTRODE REM PT RTRN 9FT ADLT (ELECTROSURGICAL) IMPLANT
GAUZE SPONGE 4X4 12PLY STRL (GAUZE/BANDAGES/DRESSINGS) ×2 IMPLANT
GAUZE XEROFORM 1X8 LF (GAUZE/BANDAGES/DRESSINGS) ×2 IMPLANT
GLOVE SURG SYN 8.0 (GLOVE) ×2 IMPLANT
GOWN STRL REUS W/ TWL LRG LVL3 (GOWN DISPOSABLE) ×1 IMPLANT
GOWN STRL REUS W/ TWL XL LVL3 (GOWN DISPOSABLE) ×1 IMPLANT
GOWN STRL REUS W/TWL 2XL LVL3 (GOWN DISPOSABLE) ×2 IMPLANT
GOWN STRL REUS W/TWL LRG LVL3 (GOWN DISPOSABLE) ×2
GOWN STRL REUS W/TWL XL LVL3 (GOWN DISPOSABLE) ×2
KIT BASIN OR (CUSTOM PROCEDURE TRAY) ×2 IMPLANT
KIT ROOM TURNOVER OR (KITS) ×2 IMPLANT
MANIFOLD NEPTUNE II (INSTRUMENTS) ×2 IMPLANT
NEEDLE HYPO 25GX1X1/2 BEV (NEEDLE) IMPLANT
NS IRRIG 1000ML POUR BTL (IV SOLUTION) ×2 IMPLANT
PACK ORTHO EXTREMITY (CUSTOM PROCEDURE TRAY) ×2 IMPLANT
PAD ARMBOARD 7.5X6 YLW CONV (MISCELLANEOUS) ×4 IMPLANT
PAD CAST 3X4 CTTN HI CHSV (CAST SUPPLIES) ×1 IMPLANT
PAD CAST 4YDX4 CTTN HI CHSV (CAST SUPPLIES) ×1 IMPLANT
PADDING CAST COTTON 3X4 STRL (CAST SUPPLIES) ×2
PADDING CAST COTTON 4X4 STRL (CAST SUPPLIES) ×2
PENCIL BUTTON HOLSTER BLD 10FT (ELECTRODE) IMPLANT
PLATE DISTAL RAD VOL RT 3H (Plate) ×2 IMPLANT
SCREW CORTEX 2.4X22MM VAL NEAR (Screw) ×5 IMPLANT
SCREW CORTEX VAL NEAR 2.4X20MM (Screw) ×2 IMPLANT
SCREW CORTEX VAL NEAR 2.4X24 (Screw) ×2 IMPLANT
SCREW LO-PRO TI 3.5X16MM (Screw) ×2 IMPLANT
SCREW LP TI 3.5X13MM (Screw) ×2 IMPLANT
SCREW LP TI 3.5X14MM (Screw) ×2 IMPLANT
SPONGE GAUZE 4X4 12PLY STER LF (GAUZE/BANDAGES/DRESSINGS) ×2 IMPLANT
SPONGE LAP 4X18 X RAY DECT (DISPOSABLE) IMPLANT
SPONGE SCRUB IODOPHOR (GAUZE/BANDAGES/DRESSINGS) ×2 IMPLANT
STRIP CLOSURE SKIN 1/2X4 (GAUZE/BANDAGES/DRESSINGS) ×2 IMPLANT
SUT ETHILON 4 0 PS 2 18 (SUTURE) IMPLANT
SUT PROLENE 3 0 PS 2 (SUTURE) IMPLANT
SUT VIC AB 3-0 FS2 27 (SUTURE) ×2 IMPLANT
SUT VIC AB 3-0 PS2 18 (SUTURE)
SUT VIC AB 3-0 PS2 18XBRD (SUTURE) IMPLANT
SUT VIC AB 3-0 SH 27 (SUTURE) ×2
SUT VIC AB 3-0 SH 27X BRD (SUTURE) ×1 IMPLANT
SUT VICRYL 4-0 PS2 18IN ABS (SUTURE) IMPLANT
SYR CONTROL 10ML LL (SYRINGE) IMPLANT
TOWEL OR 17X24 6PK STRL BLUE (TOWEL DISPOSABLE) ×2 IMPLANT
TOWEL OR 17X26 10 PK STRL BLUE (TOWEL DISPOSABLE) ×2 IMPLANT
TUBE CONNECTING 12X1/4 (SUCTIONS) IMPLANT
UNDERPAD 30X30 (UNDERPADS AND DIAPERS) ×2 IMPLANT
WATER STERILE IRR 1000ML POUR (IV SOLUTION) ×2 IMPLANT

## 2016-04-26 NOTE — Anesthesia Procedure Notes (Signed)
Procedure Name: LMA Insertion Date/Time: 04/26/2016 1:45 PM Performed by: Jacquiline Doe A Pre-anesthesia Checklist: Patient identified, Emergency Drugs available, Suction available and Patient being monitored Patient Re-evaluated:Patient Re-evaluated prior to inductionOxygen Delivery Method: Circle System Utilized Preoxygenation: Pre-oxygenation with 100% oxygen Intubation Type: IV induction Ventilation: Mask ventilation without difficulty LMA: LMA inserted LMA Size: 4.0 Number of attempts: 1 Placement Confirmation: positive ETCO2 Tube secured with: Tape Dental Injury: Teeth and Oropharynx as per pre-operative assessment

## 2016-04-26 NOTE — Anesthesia Preprocedure Evaluation (Addendum)
Anesthesia Evaluation  Patient identified by MRN, date of birth, ID band Patient awake    Reviewed: Allergy & Precautions, H&P , NPO status , Patient's Chart, lab work & pertinent test results  History of Anesthesia Complications (+) PONV  Airway Mallampati: II  TM Distance: >3 FB Neck ROM: Full    Dental no notable dental hx.    Pulmonary sleep apnea , former smoker,    Pulmonary exam normal breath sounds clear to auscultation       Cardiovascular Normal cardiovascular exam Rhythm:Regular Rate:Normal     Neuro/Psych negative psych ROS   GI/Hepatic negative GI ROS, Neg liver ROS,   Endo/Other  Morbid obesity  Renal/GU negative Renal ROS     Musculoskeletal   Abdominal (+) + obese,   Peds  Hematology negative hematology ROS (+)   Anesthesia Other Findings   Reproductive/Obstetrics negative OB ROS                            Anesthesia Physical Anesthesia Plan  ASA: III  Anesthesia Plan: General and Regional   Post-op Pain Management: GA combined w/ Regional for post-op pain   Induction: Intravenous  Airway Management Planned: LMA  Additional Equipment:   Intra-op Plan:   Post-operative Plan:   Informed Consent: I have reviewed the patients History and Physical, chart, labs and discussed the procedure including the risks, benefits and alternatives for the proposed anesthesia with the patient or authorized representative who has indicated his/her understanding and acceptance.     Plan Discussed with: CRNA and Surgeon  Anesthesia Plan Comments: (SC[facility])       Anesthesia Quick Evaluation

## 2016-04-26 NOTE — Transfer of Care (Signed)
Immediate Anesthesia Transfer of Care Note  Patient: Ana Jones  Procedure(s) Performed: Procedure(s) with comments: OPEN REDUCTION INTERNAL FIXATION (ORIF) DISTAL RADIAL FRACTURE, right (Right) - Axillary block CARPAL TUNNEL RELEASE, right (Right)  Patient Location: PACU  Anesthesia Type:General and GA combined with regional for post-op pain  Level of Consciousness: awake, oriented, sedated, patient cooperative and responds to stimulation  Airway & Oxygen Therapy: Patient Spontanous Breathing and Patient connected to nasal cannula oxygen  Post-op Assessment: Report given to RN, Post -op Vital signs reviewed and stable, Patient moving all extremities and Patient moving all extremities X 4  Post vital signs: Reviewed and stable  Last Vitals: There were no vitals filed for this visit.  Last Pain:  Vitals:   04/26/16 1135  PainSc: 4       Patients Stated Pain Goal: 4 ([id number])  Complications: No apparent anesthesia complications

## 2016-04-26 NOTE — Anesthesia Procedure Notes (Addendum)
Anesthesia Regional Block:  Supraclavicular block  Pre-Anesthetic Checklist: ,, timeout performed, Correct Patient, Correct Site, Correct Laterality, Correct Procedure, Correct Position, site marked, Risks and benefits discussed,  Surgical consent,  Pre-op evaluation,  At surgeon's request and post-op pain management  Laterality: Right  Prep: chloraprep       Needles:  Injection technique: Single-shot  Needle Type: Echogenic Stimulator Needle     Needle Length: 9cm 9 cm Needle Gauge: 21 and 21 G    Additional Needles:  Procedures: ultrasound guided (picture in chart) Supraclavicular block Narrative:  Start time: 04/26/2016 12:55 PM End time: 04/26/2016 1:05 PM Injection made incrementally with aspirations every 5 mL.  Performed by: Personally  Anesthesiologist: Lyn Hollingshead

## 2016-04-26 NOTE — Progress Notes (Signed)
Orthopedic Tech Progress Note Patient Details:  Ana Jones 1951-02-17 [id number]  Ortho Devices Type of Ortho Device: Arm sling Ortho Device/Splint Location: RUE Ortho Device/Splint Interventions: Ordered, Application   Braulio Bosch 04/26/2016, 3:51 PM

## 2016-04-26 NOTE — H&P (Signed)
Ana Jones is an 65 y.o. female.   Chief Complaint: right wrist pain and deformity HPI: as above s/p fall around  2 weeks ago with displaced right distal radius fracture  Past Medical History:  Diagnosis Date  . Arthritis    Osteoartitis  . Complication of anesthesia   . History of blood transfusion    leukima  . History of DVT (deep vein thrombosis) 12/2015  . 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 Nerve pain    from polio - on Topamax- in legs  . Overactive bladder   . Polio   . PONV (postoperative nausea and vomiting) 1984  . Pulmonary embolism ([place]) 12/2015  . Shortness of breath dyspnea 12/2015   with PE- other times shortness of breath with exertion  . Sleep apnea   . Urinary incontinence     Past Surgical History:  Procedure Laterality Date  . ABDOMINAL HYSTERECTOMY     was a vaginal  . BACK SURGERY     Lipoma removed  . COLONOSCOPY W/ POLYPECTOMY    . HIP ARTHROSCOPY Right    removed bone spurs  . KNEE ARTHROSCOPY Right    x 2  . KNEE ARTHROSCOPY Left    x1  . KNEE SURGERY Right 08/2014  . TUMOR REMOVAL Left 1980's   face  . VAGINAL HYSTERECTOMY  1984    Family History  Problem Relation Age of Onset  . Heart failure Mother   . Heart disease Mother   . Alzheimer's disease Mother   . Kidney disease Father   . Pulmonary embolism Brother   . CVA Brother    Social History:  reports that she quit smoking about 3 years ago. Her smoking use included Cigarettes. She has never used smokeless tobacco. She reports that she drinks alcohol. She reports that she does not use drugs.  Allergies:  Allergies  Allergen Reactions  . Contrast Media [Iodinated Diagnostic Agents] Anaphylaxis  . Iodides Anaphylaxis    WITH CONTRAST  . Sulfa Antibiotics Itching, Swelling and Rash  . Sulfamethoxazole Itching, Swelling and Rash    Medications Prior to Admission  Medication Sig Dispense Refill  . acetaminophen (TYLENOL) 500 MG tablet Take 500 mg by mouth every 6  (six) hours as needed for moderate pain or headache.    . cyclobenzaprine (FLEXERIL) 10 MG tablet Take 10 mg by mouth at bedtime.    . diphenhydrAMINE (BENADRYL) 25 mg capsule Take 25 mg by mouth every 6 (six) hours as needed for itching or allergies.     . methocarbamol (ROBAXIN) 750 MG tablet Take 750 mg by mouth at bedtime as needed for muscle spasms (Severe Spasms that are not relieved by Cycobenzaoprine).     . mirabegron ER (MYRBETRIQ) 50 MG TB24 tablet Take 50 mg by mouth daily.    Marland Kitchen topiramate (TOPAMAX) 50 MG tablet Take 50 mg by mouth at bedtime.    . traMADol (ULTRAM) 50 MG tablet Take 1 tablet (50 mg total) by mouth every 6 (six) hours as needed for moderate pain. 30 tablet 0  . traMADol (ULTRAM-ER) 100 MG 24 hr tablet Take 100 mg by mouth at bedtime.     Marland Kitchen warfarin (COUMADIN) 4 MG tablet Take 4 mg by mouth every evening. Wednesdays and saturdays take 2mg s and then all other days of the week take 4mg s      Results for orders placed or performed during the hospital encounter of 04/26/16 (from the past 48 hour(s))  Protime-INR  Status: Abnormal   Collection Time: 04/26/16 11:33 AM  Result Value Ref Range   Prothrombin Time 20.3 (H) 11.4 - 15.2 seconds   INR 1.71   CBC     Status: None   Collection Time: 04/26/16 11:33 AM  Result Value Ref Range   WBC 8.8 4.0 - 10.5 K/uL   RBC 4.48 3.87 - 5.11 MIL/uL   Hemoglobin 14.4 12.0 - 15.0 g/dL   HCT 43.6 36.0 - 46.0 %   MCV 97.3 78.0 - 100.0 fL   MCH 32.1 26.0 - 34.0 pg   MCHC 33.0 30.0 - 36.0 g/dL   RDW 14.0 11.5 - 15.5 %   Platelets 226 150 - 400 K/uL   No results found.  Review of Systems  All other systems reviewed and are negative.   There were no vitals taken for this visit. Physical Exam  Constitutional: She is oriented to person, place, and time. She appears well-developed and well-nourished.  HENT:  Head: Normocephalic and atraumatic.  Neck: Normal range of motion.  Cardiovascular: Normal rate.   Respiratory:  Effort normal.  Musculoskeletal:       Right wrist: She exhibits decreased range of motion, tenderness, swelling and deformity.  S/p fall with obvious swelling and deformity to right hand wrist  Neurological: She is alert and oriented to person, place, and time.  Skin: Skin is warm.  Psychiatric: She has a normal mood and affect. Her behavior is normal. Judgment and thought content normal.     Assessment/Plan As above   Plan ORIF and CTR  Derwin Reddy A, MD 04/26/2016, 1:06 PM

## 2016-04-26 NOTE — Op Note (Signed)
See dictated note.

## 2016-04-26 NOTE — Anesthesia Postprocedure Evaluation (Signed)
Anesthesia Post Note  Patient: Ana Jones  Procedure(s) Performed: Procedure(s) (LRB): OPEN REDUCTION INTERNAL FIXATION (ORIF) DISTAL RADIAL FRACTURE, right (Right) CARPAL TUNNEL RELEASE, right (Right)  Patient location during evaluation: PACU Anesthesia Type: General and Regional Level of consciousness: awake and alert Pain management: pain level controlled Vital Signs Assessment: post-procedure vital signs reviewed and stable Respiratory status: spontaneous breathing, nonlabored ventilation, respiratory function stable and patient connected to nasal cannula oxygen Cardiovascular status: blood pressure returned to baseline and stable Postop Assessment: no signs of nausea or vomiting Anesthetic complications: no    Last Vitals:  Vitals:   04/26/16 1520  BP: (!) 150/86  Pulse: 92  Resp: 12  Temp: 36.6 C    Last Pain:  Vitals:   04/26/16 1520  PainSc: 4                  Montez Hageman

## 2016-04-27 NOTE — Op Note (Signed)
NAMEKIMETHA, Ana Jones                 ACCOUNT NO.:  [id number]  MEDICAL RECORD NO.:  [id number]  LOCATION:  [facility]                         FACILITY:  [facility]  PHYSICIAN:  Sheral Apley. Jame Seelig, M.D.DATE OF BIRTH:  08-20-1950  DATE OF PROCEDURE:  04/26/2016 DATE OF DISCHARGE:                              OPERATIVE REPORT   PREOPERATIVE DIAGNOSIS:  Displaced 4-part intra-articular volar displaced fracture, distal radius, right side with median nerve compression with numbness and tingling in the median distribution.  POSTOPERATIVE DIAGNOSIS:  Displaced 4-part intra-articular volar displaced fracture, distal radius, right side with median nerve compression with numbness and tingling in the median distribution.  PROCEDURE:  Open reduction and internal fixation, displaced intra- articular 4-part volarly displaced distal radius fracture with decompression median nerve at the right wrist (carpal tunnel release).  SURGEON:  Sheral Apley. Burney Gauze, MD.  ASSISTANTLonny Prude, PA.  ANESTHESIA:  Ax block and general.  COMPLICATION:  None.  DRAINS:  None.  DESCRIPTION OF PROCEDURE:  The patient was taken to the operating suite after induction of adequate general anesthetic.  The right upper extremity was prepped and draped in usual sterile fashion.  An Esmarch was used to exsanguinate the limb.  Tourniquet was inflated to 250 mmHg. At this point in time, incision made on the volar aspect of the distal forearm and wrist area on the right side.  Skin incised sharply 6-8 cm. Dissection was carried down the interval between the flexor carpi radialis tendon and the radial artery.  We retracted the flexor carpi radialis in the midline, the radial artery to the lateral side.  The fascia underlying this was incised.  Dissection was carried down to the level of pronator quadratus.  We subperiosteally stripped pronator quadratus off the volar distal radius revealing a volar displaced  4-part intra-articular fracture.  We released brachioradialis off the radial styloid fragment to ease in reduction.  We placed an Arthrex volar plating standard plate volarly under fluoroscopic imaging to determine adequate plate position.  We then fixed it with 2 more cortical screws proximally followed by the smooth pegs distally.  Intraoperative fluoroscopy revealed adequate reduction in AP, lateral, and oblique view.  We identified the median nerve proximally in the incision, traced it to the proximal edge of the carpal canal.  We carefully identified and retracted the palmar cutaneous branch of the median nerve.  Under direct vision, we created a path dorsal and volar to the transverse carpal ligament then divided it with a curved blunt scissors releasing the median nerve.  The wound was thoroughly irrigated.  We loosely closed the pronator quadratus over the plate partially using 2-0 undyed Vicryl subcutaneously and 3-0 Prolene subcuticular stitch on the skin.  Steri-Strips, 4x4s, fluffs, and a volar splint was applied.  The patient tolerated these procedures well and went to recovery room in stable fashion.     Sheral Apley Burney Gauze, M.D.     MAW/MEDQ  D:  04/26/2016  T:  04/27/2016  Job:  [id number]

## 2016-05-03 ENCOUNTER — Encounter (HOSPITAL_COMMUNITY): Payer: Self-pay | Admitting: Orthopedic Surgery

## 2016-10-29 ENCOUNTER — Other Ambulatory Visit: Payer: Self-pay | Admitting: Internal Medicine

## 2017-10-21 ENCOUNTER — Other Ambulatory Visit: Payer: Self-pay | Admitting: Orthopedic Surgery

## 2017-10-28 ENCOUNTER — Other Ambulatory Visit: Payer: Self-pay | Admitting: Orthopedic Surgery

## 2017-11-07 ENCOUNTER — Other Ambulatory Visit: Payer: Self-pay | Admitting: Orthopedic Surgery

## 2017-11-17 NOTE — Pre-Procedure Instructions (Signed)
Ana Jones  11/17/2017      [facility] [address] AT [facility] [address] [address] Phone: [phone] Fax: [phone]    Your procedure is scheduled on 11-21-2017  Friday .  Report to [facility] Admitting at [employer] this number if you have problems the morning of surgery:  [phone]   Remember:  No food  And nothing to eat after Midnight   .                          Take these medicines the morning of surgery with A SIP OF WATER  Tylenol if needed Cyclobenzaprine(Flexeril)    Follow your physician's instructions on when to stop coumadin ,if no instructions were given call your physician  STOP TAKING ANY ASPIRIN(UNLESS OTHERWISE INSTRUCTED BY YOUR SURGEON),ANTIINFLAMATORIES (IBUPROFEN,ALEVE,MOTRIN,ADVIL,GOODY'S POWDERS),HERBAL [place] 5-7 DAYS PRIOR TO SURGERY.       Do not wear jewelry, make-up or nail polish.  Do not wear lotions, powders, or perfumes, or deodorant.  Do not shave 48 hours prior to surgery.  Men may shave face and neck.  Do not bring valuables to the hospital.  [facility] is not responsible for any belongings or valuables.  Contacts, dentures or bridgework may not be worn into surgery.  Leave your suitcase in the car.  After surgery it may be brought to your room.  For patients admitted to the hospital, discharge time will be determined by your treatment team.  Patients discharged the day of surgery will not be allowed to drive home.     [place]- Preparing For Surgery  Before surgery, you can play an important role. Because skin is not sterile, your skin needs to be as free of germs as possible. You can reduce the number of germs on your skin by washing with CHG (chlorahexidine gluconate) Soap before surgery.  CHG is an antiseptic cleaner which kills germs and bonds with the skin to continue killing germs even  after washing.    Oral Hygiene is also important to reduce your risk of infection.  Remember - BRUSH YOUR TEETH THE MORNING OF SURGERY WITH YOUR REGULAR TOOTHPASTE  Please do not use if you have an allergy to CHG or antibacterial soaps. If your skin becomes reddened/irritated stop using the CHG.  Do not shave (including legs and underarms) for at least 48 hours prior to first CHG shower. It is OK to shave your face.  Please follow these instructions carefully.   1. Shower the NIGHT BEFORE SURGERY and the MORNING OF SURGERY with CHG.   2. If you chose to wash your hair, wash your hair first as usual with your normal shampoo.  3. After you shampoo, rinse your hair and body thoroughly to remove the shampoo.  4. Use CHG as you would any other liquid soap. You can apply CHG directly to the skin and wash gently with a scrungie or a clean washcloth.   5. Apply the CHG Soap to your body ONLY FROM THE NECK DOWN.  Do not use on open wounds or open sores. Avoid contact with your eyes, ears, mouth and genitals (private parts). Wash Face and genitals (private parts)  with your normal soap.  6. Wash thoroughly, paying special attention to the area where your surgery will be performed.  7. Thoroughly rinse your body  with warm water from the neck down.  8. DO NOT shower/wash with your normal soap after using and rinsing off the CHG Soap.  9. Pat yourself dry with a CLEAN TOWEL.  10. Wear CLEAN PAJAMAS to bed the night before surgery, wear comfortable clothes the morning of surgery  11. Place CLEAN SHEETS on your bed the night of your first shower and DO NOT SLEEP WITH PETS.    Day of Surgery:  Do not apply any deodorants/lotions.  Please wear clean clothes to the hospital/surgery center.   Remember to brush your teeth WITH YOUR REGULAR TOOTHPASTE.   Please read over the following fact sheets that you were given. Surgical Site Infection Prevention

## 2017-11-18 ENCOUNTER — Other Ambulatory Visit: Payer: Self-pay

## 2017-11-18 ENCOUNTER — Encounter (HOSPITAL_COMMUNITY): Payer: Self-pay

## 2017-11-18 ENCOUNTER — Encounter (HOSPITAL_COMMUNITY)
Admission: RE | Admit: 2017-11-18 | Discharge: 2017-11-18 | Disposition: A | Discharge status: Home / Self Care | Payer: Medicare Other | Source: Ambulatory Visit | Attending: Orthopedic Surgery | Admitting: Orthopedic Surgery

## 2017-11-18 DIAGNOSIS — Z01812 Encounter for preprocedural laboratory examination: Secondary | ICD-10-CM | POA: Insufficient documentation

## 2017-11-18 DIAGNOSIS — R9431 Abnormal electrocardiogram [ECG] [EKG]: Secondary | ICD-10-CM | POA: Insufficient documentation

## 2017-11-18 DIAGNOSIS — Z01810 Encounter for preprocedural cardiovascular examination: Secondary | ICD-10-CM

## 2017-11-18 DIAGNOSIS — Z87891 Personal history of nicotine dependence: Secondary | ICD-10-CM | POA: Diagnosis not present

## 2017-11-18 DIAGNOSIS — Z86718 Personal history of other venous thrombosis and embolism: Secondary | ICD-10-CM | POA: Diagnosis not present

## 2017-11-18 DIAGNOSIS — Z79899 Other long term (current) drug therapy: Secondary | ICD-10-CM | POA: Diagnosis not present

## 2017-11-18 DIAGNOSIS — Z7901 Long term (current) use of anticoagulants: Secondary | ICD-10-CM | POA: Diagnosis not present

## 2017-11-18 DIAGNOSIS — Y831 Surgical operation with implant of artificial internal device as the cause of abnormal reaction of the patient, or of later complication, without mention of misadventure at the time of the procedure: Secondary | ICD-10-CM | POA: Diagnosis not present

## 2017-11-18 DIAGNOSIS — I10 Essential (primary) hypertension: Secondary | ICD-10-CM | POA: Insufficient documentation

## 2017-11-18 DIAGNOSIS — N3281 Overactive bladder: Secondary | ICD-10-CM | POA: Diagnosis not present

## 2017-11-18 DIAGNOSIS — G4730 Sleep apnea, unspecified: Secondary | ICD-10-CM | POA: Diagnosis not present

## 2017-11-18 DIAGNOSIS — Z6841 Body Mass Index (BMI) 40.0 and over, adult: Secondary | ICD-10-CM | POA: Diagnosis not present

## 2017-11-18 DIAGNOSIS — B91 Sequelae of poliomyelitis: Secondary | ICD-10-CM | POA: Diagnosis not present

## 2017-11-18 DIAGNOSIS — C9591 Leukemia, unspecified, in remission: Secondary | ICD-10-CM | POA: Diagnosis not present

## 2017-11-18 DIAGNOSIS — Z86711 Personal history of pulmonary embolism: Secondary | ICD-10-CM | POA: Diagnosis not present

## 2017-11-18 DIAGNOSIS — T8484XA Pain due to internal orthopedic prosthetic devices, implants and grafts, initial encounter: Secondary | ICD-10-CM | POA: Diagnosis not present

## 2017-11-18 DIAGNOSIS — M792 Neuralgia and neuritis, unspecified: Secondary | ICD-10-CM | POA: Diagnosis not present

## 2017-11-18 HISTORY — DX: Headache: R51

## 2017-11-18 HISTORY — DX: Headache, unspecified: R51.9

## 2017-11-18 LAB — BASIC METABOLIC PANEL
Anion gap: 8 (ref 5–15)
BUN: 17 mg/dL (ref 6–20)
CO2: 21 mmol/L — ABNORMAL LOW (ref 22–32)
Calcium: 9 mg/dL (ref 8.9–10.3)
Chloride: 110 mmol/L (ref 101–111)
Creatinine, Ser: 0.9 mg/dL (ref 0.44–1.00)
GFR calc Af Amer: >60 mL/min (ref >60)
GFR calc non Af Amer: >60 mL/min (ref >60)
Glucose, Bld: 117 mg/dL — ABNORMAL HIGH (ref 65–99)
Potassium: 4.2 mmol/L (ref 3.5–5.1)
Sodium: 139 mmol/L (ref 135–145)

## 2017-11-18 LAB — CBC
HCT: 47.6 % — ABNORMAL HIGH (ref 36.0–46.0)
Hemoglobin: 15.6 g/dL — ABNORMAL HIGH (ref 12.0–15.0)
MCH: 31.9 pg (ref 26.0–34.0)
MCHC: 32.8 g/dL (ref 30.0–36.0)
MCV: 97.3 fL (ref 78.0–100.0)
Platelets: 168 10*3/uL (ref 150–400)
RBC: 4.89 MIL/uL (ref 3.87–5.11)
RDW: 12.9 % (ref 11.5–15.5)
WBC: 8.3 10*3/uL (ref 4.0–10.5)

## 2017-11-18 LAB — PROTIME-INR
INR: 1.92
Prothrombin Time: 21.8 seconds — ABNORMAL HIGH (ref 11.4–15.2)

## 2017-11-18 NOTE — Progress Notes (Signed)
PCP is [facility] Cardiologist is Dr Tarri Fuller in [place]  Denies chest pain, cough, or fever Echo noted 12-14-15 States she had a stress test many years ago. Denies ever having a card cath. Reports she was told to continue taking Coumadin.( takes for blood clots)

## 2017-11-19 NOTE — Progress Notes (Signed)
Anesthesia Chart Review:  Case:  [id number] Date/Time:  11/21/17 1345   Procedure:  HARDWARE REMOVAL RIGHT WRIST (Right Wrist)   Anesthesia type:  Monitor Anesthesia Care   Pre-op diagnosis:  right distal radius   S52.570A   Location:  [facility] OR ROOM 07 / [place] [facility]   Surgeon:  Charlotte Crumb, MD      DISCUSSION: Patient is a 67 year old female scheduled for the above procedure. 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   She has had recent follow-up with multiple providers as outlined below. She is to continue Coumadin perioperatively per surgeon. Some cardiac testing done last year (see below). Based on currently available information, I would anticipate that she can proceed as planned.  VS: BP 131/73   Pulse 81   Temp 36.6 C   Resp 20   Ht 5\' 5"  (1.651 m)   Wt (!) 344 lb 1.6 oz (156.1 kg)   SpO2 96%   BMI 57.26 kg/m   PROVIDERS: (She has providers locally and in/near [place], [place].) - Markus Jarvis, MD is Family Practice at [facility] ([place]) with last visit 09/04/17. She also sees Lanier Prude, [place] last visit 09/26/17 [facility]; [place]). Phill Myron, MD is [facility]. Last visit 09/15/17 ([place]) - Hermelinda Medicus, MD is rheumatologist. Last seen 11/17/17 for chronic joint pain and osteoarthritis. ([place]). Mariea Stable, MD is cardiologist ([facility]). Last visit 02/03/17 for evaluation of abnormal EKG ("bifascicular block"; LAFB, low voltage and poor R wave progression). Echo and Holter monitor done (see below). Dr. Tarri Fuller  felt EKG findings likely related to morbid/extreme obesity. B-blocker was offered, but patient did not feel palpitations were significant. PRN cardiology follow-up recommended.  LABS: Labs reviewed: Acceptable for surgery. She reports that she was told to continue taking Coumadin. (all labs ordered are listed, but only abnormal results are displayed)  Labs Reviewed  BASIC METABOLIC PANEL - Abnormal; Notable for the following components:      Result Value   CO2 21 (*)    Glucose, Bld 117 (*)    All other components within normal limits  PROTIME-INR - Abnormal; Notable for the following components:   Prothrombin Time 21.8 (*)    All other components within normal limits  CBC - Abnormal; Notable for the following components:   Hemoglobin 15.6 (*)    HCT 47.6 (*)    All other components within normal limits    EKG: 11/18/17: NSR, LAD, low voltage QRS. Inferior infarct (age undetermined). Possible anterolateral infarct (age undetermined). The interpreting cardiologist did not feel there was any significant change since last tracing.     CV: Holter monitor 12/24/16 ([place]): This is a 24-hour Holter. Sinus rhythm is seen. Sinus tachycardia. One ventricular couplet. Unifocal PVCs. Maximum heart rate 114. Minimum heart rate 55. Average heart rate 87. Total PVCs 0.8%. Diary was not returned. No significant arrhythmia was noted otherwise.  Echo 12/24/16 ([place]): Conclusion Ejection Fraction = 50-55%. Grade I diastolic dysfunction, (abnormal relaxation pattern). The inferior vena cava is normal in size, with a normal collapsibility index. The left atrial size is normal. RVSP not  able to be calculated.  Echo 12/14/15 ([facility]): Study Conclusions - Left ventricle: The cavity size was normal. Systolic function was   normal. The estimated ejection fraction was in the range of 55%   to 60%. Wall motion was normal; there were no regional wall   motion  abnormalities. - Left atrium: The atrium was mildly dilated. - Right ventricle: The cavity size was moderately dilated. Wall   thickness was normal. - Right atrium: The atrium was mildly dilated. - Atrial septum: No defect or patent foramen ovale was identified. - Tricuspid valve: There was moderate regurgitation. - Pulmonary arteries: PA peak pressure: 37 mm Hg (S).  She reported a remote history of a stress test and denied prior cardiac cath.   Past Medical History:  Diagnosis Date  . Arthritis    Osteoartitis  . Complication of anesthesia   . Headache   . History of blood transfusion    leukima  . History of DVT (deep vein thrombosis) 12/2015  . Leukemia [facility]) 2014   2017- in remission  . Nerve pain    from polio - on Topamax- in legs  . Overactive bladder   . Polio   . PONV (postoperative nausea and vomiting) 1984  . Pulmonary embolism ([place]) 12/2015  . Shortness of breath dyspnea 12/2015   with PE- other times shortness of breath with exertion  . Sleep apnea   . Urinary incontinence     Past Surgical History:  Procedure Laterality Date  . ABDOMINAL HYSTERECTOMY     was a vaginal  . BACK SURGERY     Lipoma removed  . CARPAL TUNNEL RELEASE Right 04/26/2016   pt states not carpel tunnel was broken wrist   . COLONOSCOPY W/ POLYPECTOMY    . HIP ARTHROSCOPY Right    removed bone spurs  . KNEE ARTHROSCOPY Right    x 2  . KNEE ARTHROSCOPY Left    x1  . KNEE SURGERY Right 08/2014  . OPEN REDUCTION INTERNAL FIXATION (ORIF) DISTAL RADIAL FRACTURE Right 04/26/2016   Procedure: OPEN REDUCTION INTERNAL FIXATION (ORIF) DISTAL RADIAL FRACTURE, right;  Surgeon: Charlotte Crumb, MD;  Location: [facility];  Service: Orthopedics;  Laterality: Right;  Axillary block  . TUMOR REMOVAL Left 1980's   face  . VAGINAL HYSTERECTOMY  1984    MEDICATIONS: . acetaminophen (TYLENOL) 500 MG tablet  . cyclobenzaprine (FLEXERIL) 10 MG tablet  . oxybutynin (DITROPAN-XL) 10 MG 24 hr tablet  .  topiramate (TOPAMAX) 50 MG tablet  . traMADol (ULTRAM-ER) 100 MG 24 hr tablet  . triamcinolone cream (KENALOG) 0.1 %  . warfarin (COUMADIN) 4 MG tablet   No current facility-administered medications for this encounter.     George Hugh [facility] Short Stay Center/Anesthesiology Phone [phone] 11/19/2017 2:14 PM

## 2017-11-20 MED ORDER — CEFAZOLIN SODIUM 10 G IJ SOLR
3.0000 g | INTRAMUSCULAR | Status: AC
  Administered 2017-11-21: 3 g via INTRAVENOUS
  Filled 2017-11-20: qty 3

## 2017-11-21 ENCOUNTER — Ambulatory Visit (HOSPITAL_COMMUNITY)
Admission: RE | Admit: 2017-11-21 | Discharge: 2017-11-21 | Disposition: A | Discharge status: Home / Self Care | Payer: Medicare Other | Source: Ambulatory Visit | Attending: Orthopedic Surgery | Admitting: Orthopedic Surgery

## 2017-11-21 ENCOUNTER — Encounter (HOSPITAL_COMMUNITY)
Admission: RE | Disposition: A | Discharge status: Home / Self Care | Payer: Self-pay | Source: Ambulatory Visit | Attending: Orthopedic Surgery

## 2017-11-21 ENCOUNTER — Ambulatory Visit (HOSPITAL_COMMUNITY): Payer: Medicare Other | Admitting: Vascular Surgery

## 2017-11-21 ENCOUNTER — Encounter (HOSPITAL_COMMUNITY): Payer: Self-pay | Admitting: *Deleted

## 2017-11-21 DIAGNOSIS — T8484XA Pain due to internal orthopedic prosthetic devices, implants and grafts, initial encounter: Secondary | ICD-10-CM | POA: Diagnosis not present

## 2017-11-21 DIAGNOSIS — B91 Sequelae of poliomyelitis: Secondary | ICD-10-CM | POA: Diagnosis not present

## 2017-11-21 DIAGNOSIS — Z86718 Personal history of other venous thrombosis and embolism: Secondary | ICD-10-CM | POA: Insufficient documentation

## 2017-11-21 DIAGNOSIS — M792 Neuralgia and neuritis, unspecified: Secondary | ICD-10-CM | POA: Insufficient documentation

## 2017-11-21 DIAGNOSIS — C9591 Leukemia, unspecified, in remission: Secondary | ICD-10-CM | POA: Diagnosis not present

## 2017-11-21 DIAGNOSIS — Z86711 Personal history of pulmonary embolism: Secondary | ICD-10-CM | POA: Insufficient documentation

## 2017-11-21 DIAGNOSIS — Z87891 Personal history of nicotine dependence: Secondary | ICD-10-CM | POA: Insufficient documentation

## 2017-11-21 DIAGNOSIS — Y831 Surgical operation with implant of artificial internal device as the cause of abnormal reaction of the patient, or of later complication, without mention of misadventure at the time of the procedure: Secondary | ICD-10-CM | POA: Insufficient documentation

## 2017-11-21 DIAGNOSIS — G4730 Sleep apnea, unspecified: Secondary | ICD-10-CM | POA: Insufficient documentation

## 2017-11-21 DIAGNOSIS — Z79899 Other long term (current) drug therapy: Secondary | ICD-10-CM | POA: Insufficient documentation

## 2017-11-21 DIAGNOSIS — Z6841 Body Mass Index (BMI) 40.0 and over, adult: Secondary | ICD-10-CM | POA: Insufficient documentation

## 2017-11-21 DIAGNOSIS — Z7901 Long term (current) use of anticoagulants: Secondary | ICD-10-CM | POA: Insufficient documentation

## 2017-11-21 DIAGNOSIS — N3281 Overactive bladder: Secondary | ICD-10-CM | POA: Insufficient documentation

## 2017-11-21 HISTORY — PX: HARDWARE REMOVAL: SHX979

## 2017-11-21 SURGERY — REMOVAL, HARDWARE
Anesthesia: Monitor Anesthesia Care | Site: Wrist | Laterality: Right

## 2017-11-21 MED ORDER — LIDOCAINE-EPINEPHRINE (PF) 1 %-1:200000 IJ SOLN
INTRAMUSCULAR | Status: DC | PRN
  Administered 2017-11-21: 10 mL

## 2017-11-21 MED ORDER — CHLORHEXIDINE GLUCONATE 4 % EX LIQD: 60.0000 mL | Freq: Once | CUTANEOUS | Status: DC

## 2017-11-21 MED ORDER — BUPIVACAINE HCL (PF) 0.25 % IJ SOLN
INTRAMUSCULAR | Status: AC
  Filled 2017-11-21: qty 30

## 2017-11-21 MED ORDER — LACTATED RINGERS IV SOLN
INTRAVENOUS | Status: DC
  Administered 2017-11-21: 13:00:00 via INTRAVENOUS

## 2017-11-21 MED ORDER — LIDOCAINE-EPINEPHRINE 1 %-1:100000 IJ SOLN
INTRAMUSCULAR | Status: AC
  Filled 2017-11-21: qty 1

## 2017-11-21 MED ORDER — PROPOFOL 10 MG/ML IV BOLUS
INTRAVENOUS | Status: DC | PRN
  Administered 2017-11-21: 20 mg via INTRAVENOUS
  Administered 2017-11-21: 40 mg via INTRAVENOUS
  Administered 2017-11-21: 20 mg via INTRAVENOUS

## 2017-11-21 MED ORDER — MIDAZOLAM HCL 2 MG/2ML IJ SOLN
INTRAMUSCULAR | Status: AC
  Filled 2017-11-21: qty 2

## 2017-11-21 MED ORDER — LIDOCAINE 2% (20 MG/ML) 5 ML SYRINGE
INTRAMUSCULAR | Status: DC | PRN
  Administered 2017-11-21: 60 mg via INTRAVENOUS

## 2017-11-21 MED ORDER — 0.9 % SODIUM CHLORIDE (POUR BTL) OPTIME
TOPICAL | Status: DC | PRN
  Administered 2017-11-21: 1000 mL

## 2017-11-21 MED ORDER — CEFAZOLIN SODIUM-DEXTROSE 2-4 GM/100ML-% IV SOLN
INTRAVENOUS | Status: AC
  Filled 2017-11-21: qty 100

## 2017-11-21 MED ORDER — FENTANYL CITRATE (PF) 250 MCG/5ML IJ SOLN
INTRAMUSCULAR | Status: AC
  Filled 2017-11-21: qty 5

## 2017-11-21 MED ORDER — MIDAZOLAM HCL 5 MG/5ML IJ SOLN
INTRAMUSCULAR | Status: DC | PRN
  Administered 2017-11-21: 2 mg via INTRAVENOUS

## 2017-11-21 SURGICAL SUPPLY — 43 items
BANDAGE ACE 3X5.8 VEL STRL LF (GAUZE/BANDAGES/DRESSINGS) ×2 IMPLANT
BANDAGE ACE 4X5 VEL STRL LF (GAUZE/BANDAGES/DRESSINGS) ×2 IMPLANT
BNDG CMPR 9X4 STRL LF SNTH (GAUZE/BANDAGES/DRESSINGS) ×1
BNDG ESMARK 4X9 LF (GAUZE/BANDAGES/DRESSINGS) ×2 IMPLANT
BNDG GAUZE ELAST 4 BULKY (GAUZE/BANDAGES/DRESSINGS) ×2 IMPLANT
CORDS BIPOLAR (ELECTRODE) IMPLANT
COVER SURGICAL LIGHT HANDLE (MISCELLANEOUS) ×2 IMPLANT
DRAPE SURG 17X23 STRL (DRAPES) ×2 IMPLANT
DURAPREP 26ML APPLICATOR (WOUND CARE) ×2 IMPLANT
ELECT REM PT RETURN 9FT ADLT (ELECTROSURGICAL)
ELECTRODE REM PT RTRN 9FT ADLT (ELECTROSURGICAL) IMPLANT
GAUZE SPONGE 4X4 12PLY STRL (GAUZE/BANDAGES/DRESSINGS) ×2 IMPLANT
GAUZE XEROFORM 1X8 LF (GAUZE/BANDAGES/DRESSINGS) ×2 IMPLANT
GLOVE SURG SYN 8.0 (GLOVE) ×2 IMPLANT
GOWN STRL REUS W/ TWL LRG LVL3 (GOWN DISPOSABLE) ×1 IMPLANT
GOWN STRL REUS W/ TWL XL LVL3 (GOWN DISPOSABLE) ×1 IMPLANT
GOWN STRL REUS W/TWL LRG LVL3 (GOWN DISPOSABLE) ×2
GOWN STRL REUS W/TWL XL LVL3 (GOWN DISPOSABLE) ×2
KIT BASIN OR (CUSTOM PROCEDURE TRAY) ×2 IMPLANT
KIT TURNOVER KIT B (KITS) ×2 IMPLANT
MANIFOLD NEPTUNE II (INSTRUMENTS) ×2 IMPLANT
NEEDLE 22X1 1/2 (OR ONLY) (NEEDLE) IMPLANT
NEEDLE HYPO 25GX1X1/2 BEV (NEEDLE) IMPLANT
NS IRRIG 1000ML POUR BTL (IV SOLUTION) ×2 IMPLANT
PACK ORTHO EXTREMITY (CUSTOM PROCEDURE TRAY) ×2 IMPLANT
PAD ARMBOARD 7.5X6 YLW CONV (MISCELLANEOUS) ×4 IMPLANT
PAD CAST 3X4 CTTN HI CHSV (CAST SUPPLIES) ×1 IMPLANT
PAD CAST 4YDX4 CTTN HI CHSV (CAST SUPPLIES) ×1 IMPLANT
PADDING CAST COTTON 3X4 STRL (CAST SUPPLIES) ×2
PADDING CAST COTTON 4X4 STRL (CAST SUPPLIES) ×2
PENCIL BUTTON HOLSTER BLD 10FT (ELECTRODE) IMPLANT
SPONGE LAP 4X18 RFD (DISPOSABLE) ×4 IMPLANT
STRIP CLOSURE SKIN 1/2X4 (GAUZE/BANDAGES/DRESSINGS) IMPLANT
SUCTION FRAZIER HANDLE 10FR (MISCELLANEOUS) ×1
SUCTION TUBE FRAZIER 10FR DISP (MISCELLANEOUS) ×1 IMPLANT
SUT ETHILON 4 0 PS 2 18 (SUTURE) ×2 IMPLANT
SUT PROLENE 3 0 PS 2 (SUTURE) IMPLANT
SYR CONTROL 10ML LL (SYRINGE) ×2 IMPLANT
TOWEL OR 17X24 6PK STRL BLUE (TOWEL DISPOSABLE) ×2 IMPLANT
TOWEL OR 17X26 10 PK STRL BLUE (TOWEL DISPOSABLE) ×2 IMPLANT
TUBE CONNECTING 12X1/4 (SUCTIONS) IMPLANT
UNDERPAD 30X30 (UNDERPADS AND DIAPERS) ×2 IMPLANT
WATER STERILE IRR 1000ML POUR (IV SOLUTION) ×2 IMPLANT

## 2017-11-21 NOTE — Anesthesia Procedure Notes (Addendum)
Procedure Name: MAC Date/Time: 11/21/2017 1:33 PM Performed by: Kyung Rudd, CRNA Pre-anesthesia Checklist: Patient identified, Emergency Drugs available, Suction available and Patient being monitored Patient Re-evaluated:Patient Re-evaluated prior to induction Oxygen Delivery Method: Nasal cannula Induction Type: IV induction Placement Confirmation: positive ETCO2

## 2017-11-21 NOTE — Anesthesia Preprocedure Evaluation (Addendum)
Anesthesia Evaluation  Patient identified by MRN, date of birth, ID band Patient awake    Reviewed: Allergy & Precautions, H&P , NPO status , Patient's Chart, lab work & pertinent test results  History of Anesthesia Complications (+) PONV  Airway Mallampati: II  TM Distance: >3 FB Neck ROM: Full    Dental no notable dental hx. (+) Teeth Intact, Dental Advisory Given   Pulmonary sleep apnea , former smoker,    Pulmonary exam normal breath sounds clear to auscultation       Cardiovascular Normal cardiovascular exam Rhythm:Regular Rate:Normal     Neuro/Psych negative psych ROS   GI/Hepatic negative GI ROS, Neg liver ROS,   Endo/Other  Morbid obesity  Renal/GU negative Renal ROS  Female GU complaint     Musculoskeletal   Abdominal (+) + obese,   Peds  Hematology negative hematology ROS (+)   Anesthesia Other Findings   Reproductive/Obstetrics negative OB ROS                            Anesthesia Physical  Anesthesia Plan  ASA: III  Anesthesia Plan: General and MAC   Post-op Pain Management:    Induction: Intravenous  PONV Risk Score and Plan: Ondansetron, Dexamethasone and Midazolam  Airway Management Planned: LMA, Natural Airway and Simple Face Mask  Additional Equipment:   Intra-op Plan:   Post-operative Plan:   Informed Consent: I have reviewed the patients History and Physical, chart, labs and discussed the procedure including the risks, benefits and alternatives for the proposed anesthesia with the patient or authorized representative who has indicated his/her understanding and acceptance.   Dental advisory given  Plan Discussed with: CRNA and Surgeon  Anesthesia Plan Comments: ([facility])        Anesthesia Quick Evaluation

## 2017-11-21 NOTE — Progress Notes (Signed)
Arrived pacu a/o x 4 asking for coffee and morphine  For some4 back discomfort. Moves r fingers w/out issue and can give thumb's up/ States her hand does not hurt. Reassured she is doing well and shortly ready for d/c to which she was receptive

## 2017-11-21 NOTE — H&P (Signed)
Ana Jones is an 67 y.o. female.   Chief Complaint: Right wrist pain and swelling HPI: Patient is a very pleasant 67 year old female status post operative fixation of a displaced distal radius fracture on her right volar wrist with prominent loose hardware  Past Medical History:  Diagnosis Date  . Arthritis    Osteoartitis  . Complication of anesthesia   . Headache   . History of blood transfusion    leukima  . History of DVT (deep vein thrombosis) 12/2015  . Leukemia [facility]) 2014   2017- in remission  . Nerve pain    from polio - on Topamax- in legs  . Overactive bladder   . Polio   . PONV (postoperative nausea and vomiting) 1984  . Pulmonary embolism ([place]) 12/2015  . Shortness of breath dyspnea 12/2015   with PE- other times shortness of breath with exertion  . Sleep apnea   . Urinary incontinence     Past Surgical History:  Procedure Laterality Date  . ABDOMINAL HYSTERECTOMY     was a vaginal  . BACK SURGERY     Lipoma removed  . CARPAL TUNNEL RELEASE Right 04/26/2016   pt states not carpel tunnel was broken wrist   . COLONOSCOPY W/ POLYPECTOMY    . HIP ARTHROSCOPY Right    removed bone spurs  . KNEE ARTHROSCOPY Right    x 2  . KNEE ARTHROSCOPY Left    x1  . KNEE SURGERY Right 08/2014  . OPEN REDUCTION INTERNAL FIXATION (ORIF) DISTAL RADIAL FRACTURE Right 04/26/2016   Procedure: OPEN REDUCTION INTERNAL FIXATION (ORIF) DISTAL RADIAL FRACTURE, right;  Surgeon: Charlotte Crumb, MD;  Location: [facility];  Service: Orthopedics;  Laterality: Right;  Axillary block  . TUMOR REMOVAL Left 1980's   face  . VAGINAL HYSTERECTOMY  1984    Family History  Problem Relation Age of Onset  . Heart failure Mother   . Heart disease Mother   . Alzheimer's disease Mother   . Kidney disease Father   . Pulmonary embolism Brother   . CVA Brother    Social History:  reports that she quit smoking about 5 years ago. Her smoking use included cigarettes. She has never used smokeless  tobacco. She reports that she drinks alcohol. She reports that she does not use drugs.  Allergies:  Allergies  Allergen Reactions  . Iodides Anaphylaxis    WITH CONTRAST  . Iodinated Diagnostic Agents Anaphylaxis, Hives and Swelling    Per pt, happened in 2012   . Sulfa Antibiotics Itching, Swelling and Rash  . Sulfamethoxazole Itching, Swelling and Rash    Medications Prior to Admission  Medication Sig Dispense Refill  . acetaminophen (TYLENOL) 500 MG tablet Take 1,000 mg by mouth 2 (two) times daily as needed for moderate pain or headache.     . cyclobenzaprine (FLEXERIL) 10 MG tablet Take 10 mg by mouth daily as needed (cramps).     Marland Kitchen oxybutynin (DITROPAN-XL) 10 MG 24 hr tablet Take 10 mg by mouth at bedtime.    . topiramate (TOPAMAX) 50 MG tablet Take 50 mg by mouth at bedtime.    . traMADol (ULTRAM-ER) 100 MG 24 hr tablet Take 100 mg by mouth at bedtime.     . triamcinolone cream (KENALOG) 0.1 % Apply 1 application topically 2 (two) times daily as needed for rash.  0  . warfarin (COUMADIN) 4 MG tablet Take 4 mg by mouth every evening. Take 4 mg by mouth at night on Tues,  Thurs, Sat, and Sun.  Take 6 mg by mouth at night on Mon, Wed, Fri      No results found for this or any previous visit (from the past 48 hour(s)). No results found.  Review of Systems  All other systems reviewed and are negative.   Blood pressure 131/66, pulse 72, temperature 97.8 F (36.6 C), temperature source Oral, resp. rate 18, weight (!) 156 kg (344 lb), SpO2 97 %. Physical Exam  Constitutional: She is oriented to person, place, and time. She appears well-developed and well-nourished.  HENT:  Head: Normocephalic and atraumatic.  Neck: Normal range of motion.  Cardiovascular: Normal rate.  Respiratory: Effort normal.  Musculoskeletal:       Right wrist: She exhibits tenderness and swelling.  Right wrist volar pain and swelling over previous incision  Neurological: She is alert and oriented to  person, place, and time.  Skin: Skin is warm.  Psychiatric: She has a normal mood and affect. Her behavior is normal. Judgment and thought content normal.     Assessment/Plan 67 year old female with retained painful hardware volar aspect right wrist.  Have discussed hardware removal under IV sedation and local anesthetic as an outpatient.  Patient understands risks and benefits and wishes to proceed  Schuyler Amor, MD 11/21/2017, 1:07 PM

## 2017-11-21 NOTE — Op Note (Signed)
See note [id number]

## 2017-11-21 NOTE — Transfer of Care (Signed)
Immediate Anesthesia Transfer of Care Note  Patient: Ana Jones  Procedure(s) Performed: HARDWARE REMOVAL RIGHT WRIST (Right Wrist)  Patient Location: PACU  Anesthesia Type:MAC  Level of Consciousness: awake, alert  and oriented  Airway & Oxygen Therapy: Patient Spontanous Breathing and Patient connected to nasal cannula oxygen  Post-op Assessment: Report given to RN, Post -op Vital signs reviewed and stable and Patient moving all extremities X 4  Post vital signs: Reviewed and stable  Last Vitals:  Vitals Value Taken Time  BP 136/80 11/21/2017  2:12 PM  Temp    Pulse 72 11/21/2017  2:13 PM  Resp 11 11/21/2017  2:13 PM  SpO2 98 % 11/21/2017  2:13 PM  Vitals shown include unvalidated device data.  Last Pain:  Vitals:   11/21/17 1239  TempSrc:   PainSc: 0-No pain         Complications: No apparent anesthesia complications

## 2017-11-22 NOTE — Op Note (Signed)
Ana Jones, Ana Jones MEDICAL RECORD IN:[id number] ACCOUNT [id number] DATE OF BIRTH:[birth year] FACILITY: [facility] LOCATION: [facility] PHYSICIAN:Corra Kaine A. Burney Gauze, MD  OPERATIVE REPORT  DATE OF PROCEDURE:  11/21/2017  PREOPERATIVE DIAGNOSIS:  Painful retained hardware volar aspect of right wrist deep.  POSTOPERATIVE DIAGNOSIS:  Painful retained hardware volar aspect of right wrist deep.  PROCEDURE:  Excision of retained painful hardware, right wrist.  SURGEON:  Charlotte Crumb, MD  ASSISTANT:  None.  ANESTHESIA:  Monitored anesthesia care with local infiltrative anesthetic provided by the surgeon.  TOURNIQUET:  None.   COMPLICATIONS:  None.  DRAINS:  None.  DESCRIPTION OF PROCEDURE:  The patient was taken to the operating suite after induction of adequate IV sedation.  I injected 10 mL of 1% lidocaine with epinephrine 1:100,000 around a prominent palmer screw from her previous volar plating.  After 10  minutes, we prepped and draped in the usual sterile fashion.  I made a small incision over the prominent hardware, dissected down through the skin and subcutaneous tissues, carefully identifying the median nerve and retracting it to the midline and the  FCR to the lateral side.  Dissection was carried down to the prominent hardware which was removed with no undue difficulty.  The wound was irrigated and loosely closed with 4-0 nylon.  Xeroform, 4 x 4's, and a compressive wrap was applied.  The patient  tolerated this procedure well and went to recovery room in stable fashion.  LN/NUANCE  D:11/21/2017 T:11/22/2017 JOB:[id number]

## 2017-11-24 ENCOUNTER — Encounter (HOSPITAL_COMMUNITY): Payer: Self-pay | Admitting: Orthopedic Surgery

## 2017-11-24 NOTE — Anesthesia Postprocedure Evaluation (Signed)
Anesthesia Post Note  Patient: Ana Jones  Procedure(s) Performed: HARDWARE REMOVAL RIGHT WRIST (Right Wrist)     Patient location during evaluation: PACU Anesthesia Type: MAC Level of consciousness: awake Pain management: pain level controlled Vital Signs Assessment: post-procedure vital signs reviewed and stable Respiratory status: spontaneous breathing Cardiovascular status: stable Postop Assessment: no apparent nausea or vomiting Anesthetic complications: no    Last Vitals:  Vitals:   11/21/17 1412 11/21/17 1433  BP: 136/80 (!) 163/95  Pulse:  89  Resp:    Temp: (!) 36.3 C 36.5 C  SpO2: 99% 95%    Last Pain:  Vitals:   11/21/17 1433  TempSrc:   PainSc: 0-No pain   Pain Goal:                 Annica Marinello JR,JOHN Samnang Shugars

## 2020-04-03 ENCOUNTER — Other Ambulatory Visit: Payer: Self-pay | Admitting: Gastroenterology

## 2020-05-15 ENCOUNTER — Other Ambulatory Visit (HOSPITAL_COMMUNITY)
Admission: RE | Admit: 2020-05-15 | Discharge: 2020-05-15 | Disposition: A | Discharge status: Home / Self Care | Payer: Medicare PPO | Source: Ambulatory Visit | Attending: Gastroenterology | Admitting: Gastroenterology

## 2020-05-15 DIAGNOSIS — Z20822 Contact with and (suspected) exposure to covid-19: Secondary | ICD-10-CM | POA: Insufficient documentation

## 2020-05-15 DIAGNOSIS — Z01812 Encounter for preprocedural laboratory examination: Secondary | ICD-10-CM | POA: Insufficient documentation

## 2020-05-15 LAB — SARS CORONAVIRUS 2 (TAT 6-24 HRS): SARS Coronavirus 2: NEGATIVE

## 2020-05-15 NOTE — Progress Notes (Signed)
Attempted to obtain medical history via telephone, unable to reach at this time. I left a voicemail to return pre surgical testing department's phone call.  

## 2020-05-16 ENCOUNTER — Other Ambulatory Visit: Payer: Self-pay

## 2020-05-16 ENCOUNTER — Encounter (HOSPITAL_COMMUNITY): Payer: Self-pay | Admitting: Gastroenterology

## 2020-05-18 ENCOUNTER — Ambulatory Visit (HOSPITAL_COMMUNITY)
Admission: RE | Admit: 2020-05-18 | Discharge: 2020-05-18 | Disposition: A | Discharge status: Home / Self Care | Payer: Medicare PPO | Attending: Gastroenterology | Admitting: Gastroenterology

## 2020-05-18 ENCOUNTER — Other Ambulatory Visit: Payer: Self-pay

## 2020-05-18 ENCOUNTER — Encounter (HOSPITAL_COMMUNITY)
Admission: RE | Disposition: A | Discharge status: Home / Self Care | Payer: Self-pay | Source: Home / Self Care | Attending: Gastroenterology

## 2020-05-18 ENCOUNTER — Ambulatory Visit (HOSPITAL_COMMUNITY): Payer: Medicare PPO | Admitting: Certified Registered Nurse Anesthetist

## 2020-05-18 DIAGNOSIS — D125 Benign neoplasm of sigmoid colon: Secondary | ICD-10-CM | POA: Diagnosis not present

## 2020-05-18 DIAGNOSIS — Z888 Allergy status to other drugs, medicaments and biological substances status: Secondary | ICD-10-CM | POA: Diagnosis not present

## 2020-05-18 DIAGNOSIS — D120 Benign neoplasm of cecum: Secondary | ICD-10-CM | POA: Diagnosis not present

## 2020-05-18 DIAGNOSIS — Z86711 Personal history of pulmonary embolism: Secondary | ICD-10-CM | POA: Insufficient documentation

## 2020-05-18 DIAGNOSIS — Z87892 Personal history of anaphylaxis: Secondary | ICD-10-CM | POA: Insufficient documentation

## 2020-05-18 DIAGNOSIS — Z6841 Body Mass Index (BMI) 40.0 and over, adult: Secondary | ICD-10-CM | POA: Insufficient documentation

## 2020-05-18 DIAGNOSIS — Z1211 Encounter for screening for malignant neoplasm of colon: Secondary | ICD-10-CM | POA: Insufficient documentation

## 2020-05-18 DIAGNOSIS — Z86010 Personal history of colonic polyps: Secondary | ICD-10-CM | POA: Insufficient documentation

## 2020-05-18 DIAGNOSIS — Z86718 Personal history of other venous thrombosis and embolism: Secondary | ICD-10-CM | POA: Insufficient documentation

## 2020-05-18 DIAGNOSIS — Z7952 Long term (current) use of systemic steroids: Secondary | ICD-10-CM | POA: Insufficient documentation

## 2020-05-18 DIAGNOSIS — Z882 Allergy status to sulfonamides status: Secondary | ICD-10-CM | POA: Insufficient documentation

## 2020-05-18 DIAGNOSIS — G8929 Other chronic pain: Secondary | ICD-10-CM | POA: Insufficient documentation

## 2020-05-18 DIAGNOSIS — K5900 Constipation, unspecified: Secondary | ICD-10-CM | POA: Diagnosis not present

## 2020-05-18 DIAGNOSIS — K621 Rectal polyp: Secondary | ICD-10-CM | POA: Diagnosis not present

## 2020-05-18 DIAGNOSIS — D122 Benign neoplasm of ascending colon: Secondary | ICD-10-CM | POA: Insufficient documentation

## 2020-05-18 DIAGNOSIS — Z91041 Radiographic dye allergy status: Secondary | ICD-10-CM | POA: Insufficient documentation

## 2020-05-18 DIAGNOSIS — Z87891 Personal history of nicotine dependence: Secondary | ICD-10-CM | POA: Insufficient documentation

## 2020-05-18 DIAGNOSIS — K648 Other hemorrhoids: Secondary | ICD-10-CM | POA: Diagnosis not present

## 2020-05-18 DIAGNOSIS — D124 Benign neoplasm of descending colon: Secondary | ICD-10-CM | POA: Insufficient documentation

## 2020-05-18 DIAGNOSIS — K5730 Diverticulosis of large intestine without perforation or abscess without bleeding: Secondary | ICD-10-CM | POA: Diagnosis not present

## 2020-05-18 DIAGNOSIS — Z79899 Other long term (current) drug therapy: Secondary | ICD-10-CM | POA: Insufficient documentation

## 2020-05-18 HISTORY — DX: Presence of dental prosthetic device (complete) (partial): Z97.2

## 2020-05-18 HISTORY — PX: POLYPECTOMY: SHX5525

## 2020-05-18 HISTORY — PX: COLONOSCOPY WITH PROPOFOL: SHX5780

## 2020-05-18 HISTORY — DX: Presence of spectacles and contact lenses: Z97.3

## 2020-05-18 SURGERY — COLONOSCOPY WITH PROPOFOL
Anesthesia: Monitor Anesthesia Care

## 2020-05-18 MED ORDER — LACTATED RINGERS IV SOLN
INTRAVENOUS | Status: DC
  Administered 2020-05-18: 1000 mL via INTRAVENOUS

## 2020-05-18 MED ORDER — ESMOLOL HCL 100 MG/10ML IV SOLN
INTRAVENOUS | Status: DC | PRN
  Administered 2020-05-18: 10 mg via INTRAVENOUS

## 2020-05-18 MED ORDER — PROPOFOL 10 MG/ML IV BOLUS
INTRAVENOUS | Status: DC | PRN
  Administered 2020-05-18: 20 mg via INTRAVENOUS

## 2020-05-18 MED ORDER — SODIUM CHLORIDE 0.9 % IV SOLN: INTRAVENOUS | Status: DC

## 2020-05-18 MED ORDER — PROPOFOL 500 MG/50ML IV EMUL
INTRAVENOUS | Status: DC | PRN
  Administered 2020-05-18: 100 ug/kg/min via INTRAVENOUS

## 2020-05-18 MED ORDER — LIDOCAINE 2% (20 MG/ML) 5 ML SYRINGE
INTRAMUSCULAR | Status: DC | PRN
  Administered 2020-05-18: 100 mg via INTRAVENOUS

## 2020-05-18 MED ORDER — PROPOFOL 10 MG/ML IV BOLUS
INTRAVENOUS | Status: AC
  Filled 2020-05-18: qty 40

## 2020-05-18 SURGICAL SUPPLY — 21 items

## 2020-05-18 NOTE — Op Note (Signed)
[facility] Patient Name: Ana Jones Procedure Date: 05/18/2020 MRN: [id number] Attending MD: Otis Brace , MD Date of Birth: May 28, 1951 CSN: [id number] Age: 69 Admit Type: Outpatient Procedure:                Colonoscopy Indications:              High risk colon cancer surveillance: Personal                            history of colonic polyps, Last colonoscopy 5 years                            ago Providers:                Otis Brace, MD, Erenest Rasher, RN, Tyrone Apple, Technician, Christell Faith, CRNA Referring MD:              Medicines:                Sedation Administered by an Anesthesia Professional Complications:            No immediate complications. Estimated Blood Loss:     Estimated blood loss: none. Estimated blood loss                            was minimal. Procedure:                Pre-Anesthesia Assessment:                           - Prior to the procedure, a History and Physical                            was performed, and patient medications and                            allergies were reviewed. The patient's tolerance of                            previous anesthesia was also reviewed. The risks                            and benefits of the procedure and the sedation                            options and risks were discussed with the patient.                            All questions were answered, and informed consent                            was obtained. Prior Anticoagulants: The patient has                            taken  Coumadin (warfarin), last dose was 5 days                            prior to procedure. ASA Grade Assessment: III - A                            patient with severe systemic disease. After                            reviewing the risks and benefits, the patient was                            deemed in satisfactory condition to undergo the                             procedure.                           After obtaining informed consent, the colonoscope                            was passed under direct vision. Throughout the                            procedure, the patient's blood pressure, pulse, and                            oxygen saturations were monitored continuously. The                            PCF-H190DL ([id number]) Olympus pediatric colonscope                            was introduced through the anus and advanced to the                            the cecum, identified by appendiceal orifice and                            ileocecal valve. The colonoscopy was performed                            without difficulty. The patient tolerated the                            procedure well. The quality of the bowel                            preparation was adequate to identify polyps 6 mm                            and larger in size. Scope In: 8:40:05 AM Scope Out: 9:13:58 AM Scope Withdrawal Time: 0 hours 24 minutes 12 seconds  Total Procedure Duration: 0 hours 33 minutes  53 seconds  Findings:      The perianal and digital rectal examinations were normal.      A 4 mm polyp was found in the cecum. The polyp was sessile. The polyp       was removed with a cold snare. Resection and retrieval were complete.      Five sessile polyps were found in the descending colon. The polyps were       3 to 5 mm in size. These polyps were removed with a cold snare.       Resection and retrieval were complete.      Three sessile polyps were found in the sigmoid colon. The polyps were 3       to 4 mm in size. These polyps were removed with a cold snare. Resection       and retrieval were complete.      Three sessile polyps were found in the rectum. The polyps were 3 to 5 mm       in size. These polyps were removed with a cold snare. Resection and       retrieval were complete.      Many small and large-mouthed diverticula were found in the sigmoid colon        and descending colon.      Internal hemorrhoids were found during retroflexion. The hemorrhoids       were medium-sized. Impression:               - One 4 mm polyp in the cecum, removed with a cold                            snare. Resected and retrieved.                           - Five 3 to 5 mm polyps in the descending colon,                            removed with a cold snare. Resected and retrieved.                           - Three 3 to 4 mm polyps in the sigmoid colon,                            removed with a cold snare. Resected and retrieved.                           - Three 3 to 5 mm polyps in the rectum, removed                            with a cold snare. Resected and retrieved.                           - Diverticulosis in the sigmoid colon and in the                            descending colon.                           -  Internal hemorrhoids. Moderate Sedation:      Moderate (conscious) sedation was personally administered by an       anesthesia professional. The following parameters were monitored: oxygen       saturation, heart rate, blood pressure, and response to care. Recommendation:           - Patient has a contact number available for                            emergencies. The signs and symptoms of potential                            delayed complications were discussed with the                            patient. Return to normal activities tomorrow.                            Written discharge instructions were provided to the                            patient.                           - Resume previous diet.                           - Continue present medications.                           - Await pathology results.                           - Repeat colonoscopy date to be determined after                            pending pathology results are reviewed for                            surveillance of multiple polyps.                           - Resume  Coumadin (warfarin) at prior dose today.                           - Return to GI office as previously scheduled. Procedure Code(s):        --- Professional ---                           [phone], Colonoscopy, flexible; with removal of                            tumor(s), polyp(s), or other lesion(s) by snare                            technique Diagnosis Code(s):        ---  Professional ---                           Z86.010, Personal history of colonic polyps                           K63.5, Polyp of colon                           K62.1, Rectal polyp                           K64.8, Other hemorrhoids                           K57.30, Diverticulosis of large intestine without                            perforation or abscess without bleeding CPT copyright 2019 American Medical Association. All rights reserved. The codes documented in this report are preliminary and upon coder review may  be revised to meet current compliance requirements. Otis Brace, MD Otis Brace, MD 05/18/2020 9:22:19 AM Number of Addenda: 0

## 2020-05-18 NOTE — H&P (Signed)
Primary Care Physician:  Default, Provider, MD Primary Gastroenterologist:  [facility]  Reason for Visit : Surveillance colonoscopy  HPI: Ana Jones is a 69 y.o. female with past medical history of morbid obesity with BMI of 57, history of DVT and PE on Coumadin here for outpatient surveillance colonoscopy.  She is complaining of chronic periumbilical abdominal pain.  Complaining of constipation.  She started having diarrhea with use of Linzess 72 mcg.  Currently takes MiraLAX and Gas-X as needed with improvement in symptoms.  She denies blood in the stool or black stool.  Denies nausea or vomiting.  Coumadin on hold since last 5 days.  Past Medical History:  Diagnosis Date  . Arthritis    Osteoartitis  . Complication of anesthesia   . Headache   . History of blood transfusion    leukima  . History of DVT (deep vein thrombosis) 12/2015  . 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 Nerve pain    from polio - on Topamax- in legs  . Overactive bladder   . Polio   . PONV (postoperative nausea and vomiting) 1984   pt denies  . Pulmonary embolism ([place]) 12/2015  . Shortness of breath dyspnea 12/2015   with PE- other times shortness of breath with exertion  . Sleep apnea   . Urinary incontinence   . Wears dentures   . Wears glasses     Past Surgical History:  Procedure Laterality Date  . ABDOMINAL HYSTERECTOMY     was a vaginal  . BACK SURGERY     Lipoma removed  . CARPAL TUNNEL RELEASE Right 04/26/2016   pt states not carpel tunnel was broken wrist   . COLONOSCOPY W/ POLYPECTOMY    . HARDWARE REMOVAL Right 11/21/2017   Procedure: HARDWARE REMOVAL RIGHT WRIST;  Surgeon: Charlotte Crumb, MD;  Location: [facility];  Service: Orthopedics;  Laterality: Right;  . HIP ARTHROSCOPY Right    removed bone spurs  . KNEE ARTHROSCOPY Right    x 2  . KNEE ARTHROSCOPY Left    x1  . KNEE SURGERY Right 08/2014  . OPEN REDUCTION INTERNAL FIXATION (ORIF) DISTAL RADIAL FRACTURE Right  04/26/2016   Procedure: OPEN REDUCTION INTERNAL FIXATION (ORIF) DISTAL RADIAL FRACTURE, right;  Surgeon: Charlotte Crumb, MD;  Location: [facility];  Service: Orthopedics;  Laterality: Right;  Axillary block  . TUMOR REMOVAL Left 1980's   face  . VAGINAL HYSTERECTOMY  1984    Prior to Admission medications   Medication Sig Start Date End Date Taking? Authorizing Provider  acetaminophen (TYLENOL) 500 MG tablet Take 1,000 mg by mouth 2 (two) times daily as needed for moderate pain or headache.    Yes [provider]  cyclobenzaprine (FLEXERIL) 10 MG tablet Take 10 mg by mouth daily as needed (cramps).    Yes [provider]  oxybutynin (DITROPAN-XL) 10 MG 24 hr tablet Take 10 mg by mouth at bedtime.   Yes [provider]  predniSONE (DELTASONE) 5 MG tablet Take 5 mg by mouth daily with breakfast.   Yes [provider]  topiramate (TOPAMAX) 50 MG tablet Take 50 mg by mouth at bedtime. 03/26/16  Yes [provider]  traMADol (ULTRAM-ER) 100 MG 24 hr tablet Take 100 mg by mouth at bedtime.    Yes [provider]  triamcinolone cream (KENALOG) 0.1 % Apply 1 application topically 2 (two) times daily as needed for rash. 09/15/17  Yes [provider]  warfarin (COUMADIN) 4  MG tablet Take 4 mg by mouth every evening. Take 4 mg by mouth at night on Tues,Wed,  Thurs, Sat, and Sun.  Take 6 mg by mouth at night on Mon,  Fri 03/26/16   [provider]    Scheduled Meds: Continuous Infusions: . sodium chloride    . lactated ringers 1,000 mL (05/18/20 0817)   PRN Meds:.  Allergies as of 04/03/2020 - Review Complete 11/21/2017  Allergen Reaction Noted  . Iodides Anaphylaxis 08/12/2014  . Iodinated diagnostic agents Anaphylaxis, Hives, and Swelling 06/18/2012  . Sulfa antibiotics Itching, Swelling, and Rash 06/18/2012  . Sulfamethoxazole Itching, Swelling, and Rash 08/07/2012    Family History  Problem Relation Age of Onset  . Heart  failure Mother   . Heart disease Mother   . Alzheimer's disease Mother   . Kidney disease Father   . Pulmonary embolism Brother   . CVA Brother     Social History   Socioeconomic History  . Marital status: Single    Spouse name: Not on file  . Number of children: Not on file  . Years of education: Not on file  . Highest education level: Not on file  Occupational History  . Not on file  Tobacco Use  . Smoking status: Former Smoker    Types: Cigarettes    Quit date: 05/15/2012    Years since quitting: 8.0  . Smokeless tobacco: Never Used  . Tobacco comment: 40 pack year history  Vaping Use  . Vaping Use: Never used  Substance and Sexual Activity  . Alcohol use: Yes    Alcohol/week: 0.0 standard drinks    Comment: rare  . Drug use: No  . Sexual activity: Not on file  Other Topics Concern  . Not on file  Social History Narrative  . Not on file   Social Determinants of Health   Financial Resource Strain: Not on file  Food Insecurity: Not on file  Transportation Needs: Not on file  Physical Activity: Not on file  Stress: Not on file  Social Connections: Not on file  Intimate Partner Violence: Not on file      Physical Exam: Vital signs: Vitals:   05/18/20 0815  BP: (!) 146/56  Pulse: 93  Resp: 19  Temp: 97.9 F (36.6 C)  SpO2: 97%     General:   Morbidly obese, not in acute distress Lungs:  Clear throughout to auscultation.   No wheezes, crackles, or rhonchi. No acute distress. Heart:  Regular rate and rhythm; no murmurs, clicks, rubs,  or gallops. Abdomen: Mild periumbilical discomfort without any tenderness, abdomen is obese but otherwise nontender.  Difficult examination.  Bowel sounds present. Rectal:  Deferred  GI:  Lab Results: No results for input(s): WBC, HGB, HCT, PLT in the last 72 hours. BMET No results for input(s): NA, K, CL, CO2, GLUCOSE, BUN, CREATININE, CALCIUM in the last 72 hours. LFT No results for input(s): PROT, ALBUMIN, AST,  ALT, ALKPHOS, BILITOT, BILIDIR, IBILI in the last 72 hours. PT/INR No results for input(s): LABPROT, INR in the last 72 hours.   Studies/Results: No results found.  Impression/Plan: -Personal history of colon polyps -Morbid obesity -History of DVT and PE.  Coumadin on hold for 5 days  Recommendations ---------------------- -Proceed with colonoscopy today.  Risks (bleeding, infection, bowel perforation that could require surgery, sedation-related changes in cardiopulmonary systems), benefits (identification and possible treatment of source of symptoms, exclusion of certain causes of symptoms), and alternatives (watchful waiting, radiographic imaging studies, empiric  medical treatment)  were explained to patient in detail and patient wishes to proceed.    LOS: 0 days   Otis Brace  MD, FACP 05/18/2020, 8:28 AM  Contact #  [phone]

## 2020-05-18 NOTE — Discharge Instructions (Signed)

## 2020-05-18 NOTE — Transfer of Care (Signed)
Immediate Anesthesia Transfer of Care Note  Patient: Ana Jones  Procedure(s) Performed: COLONOSCOPY WITH PROPOFOL (N/A ) POLYPECTOMY  Patient Location: Endoscopy Unit  Anesthesia Type:MAC  Level of Consciousness: awake, alert , oriented and patient cooperative  Airway & Oxygen Therapy: Patient Spontanous Breathing and Patient connected to face mask oxygen  Post-op Assessment: Report given to RN and Post -op Vital signs reviewed and stable  Post vital signs: Reviewed and stable  Last Vitals:  Vitals Value Taken Time  BP    Temp    Pulse 100 05/18/20 0922  Resp 19 05/18/20 0922  SpO2 99 % 05/18/20 0922  Vitals shown include unvalidated device data.  Last Pain:  Vitals:   05/18/20 0815  TempSrc: Oral  PainSc: 0-No pain         Complications: No complications documented.

## 2020-05-18 NOTE — Anesthesia Preprocedure Evaluation (Signed)
Anesthesia Evaluation  Patient identified by MRN, date of birth, ID band Patient awake    Reviewed: Allergy & Precautions, H&P , NPO status , Patient's Chart, lab work & pertinent test results  History of Anesthesia Complications (+) PONV and history of anesthetic complications  Airway Mallampati: I  TM Distance: >3 FB Neck ROM: Full    Dental no notable dental hx. (+) Teeth Intact, Dental Advisory Given   Pulmonary sleep apnea , former smoker,    Pulmonary exam normal breath sounds clear to auscultation       Cardiovascular Normal cardiovascular exam Rhythm:Regular Rate:Normal     Neuro/Psych  Headaches,  Neuromuscular disease negative psych ROS   GI/Hepatic negative GI ROS, Neg liver ROS,   Endo/Other  Morbid obesity  Renal/GU negative Renal ROS  Female GU complaint     Musculoskeletal  (+) Arthritis , Osteoarthritis,    Abdominal (+) + obese,   Peds  Hematology negative hematology ROS (+)   Anesthesia Other Findings   Reproductive/Obstetrics negative OB ROS                             Anesthesia Physical  Anesthesia Plan  ASA: III  Anesthesia Plan: MAC   Post-op Pain Management:    Induction: Intravenous  PONV Risk Score and Plan: Ondansetron, Dexamethasone and Midazolam  Airway Management Planned: LMA, Natural Airway, Simple Face Mask and Nasal Cannula  Additional Equipment: None  Intra-op Plan:   Post-operative Plan:   Informed Consent: I have reviewed the patients History and Physical, chart, labs and discussed the procedure including the risks, benefits and alternatives for the proposed anesthesia with the patient or authorized representative who has indicated his/her understanding and acceptance.     Dental advisory given  Plan Discussed with: CRNA, Surgeon and Anesthesiologist  Anesthesia Plan Comments: ([facility])        Anesthesia Quick Evaluation

## 2020-05-18 NOTE — Anesthesia Procedure Notes (Addendum)
Procedure Name: MAC Date/Time: 05/18/2020 8:32 AM Performed by: West Pugh, CRNA Pre-anesthesia Checklist: Patient identified, Emergency Drugs available, Suction available, Patient being monitored and Timeout performed Patient Re-evaluated:Patient Re-evaluated prior to induction Oxygen Delivery Method: Simple face mask Preoxygenation: Pre-oxygenation with 100% oxygen Induction Type: IV induction Placement Confirmation: positive ETCO2 Dental Injury: Teeth and Oropharynx as per pre-operative assessment

## 2020-05-19 ENCOUNTER — Encounter (HOSPITAL_COMMUNITY): Payer: Self-pay | Admitting: Gastroenterology

## 2020-05-19 LAB — SURGICAL PATHOLOGY

## 2020-05-22 NOTE — Anesthesia Postprocedure Evaluation (Signed)
Anesthesia Post Note  Patient: Ana Jones  Procedure(s) Performed: COLONOSCOPY WITH PROPOFOL (N/A ) POLYPECTOMY     Patient location during evaluation: PACU Anesthesia Type: MAC Level of consciousness: awake and alert Pain management: pain level controlled Vital Signs Assessment: post-procedure vital signs reviewed and stable Respiratory status: spontaneous breathing, nonlabored ventilation, respiratory function stable and patient connected to nasal cannula oxygen Cardiovascular status: stable and blood pressure returned to baseline Postop Assessment: no apparent nausea or vomiting Anesthetic complications: no   No complications documented.  Last Vitals:  Vitals:   05/18/20 0931 05/18/20 0940  BP: (!) 152/91 (!) 162/87  Pulse: 93 83  Resp: 16 17  Temp:    SpO2: 99% 96%    Last Pain:  Vitals:   05/18/20 0940  TempSrc:   PainSc: 0-No pain                 Terral Cooks

## 2021-07-09 ENCOUNTER — Emergency Department (HOSPITAL_COMMUNITY): Payer: Medicare PPO

## 2021-07-09 ENCOUNTER — Encounter (HOSPITAL_COMMUNITY): Payer: Self-pay | Admitting: *Deleted

## 2021-07-09 ENCOUNTER — Other Ambulatory Visit: Payer: Self-pay

## 2021-07-09 ENCOUNTER — Inpatient Hospital Stay (HOSPITAL_COMMUNITY)
Admission: EM | Admit: 2021-07-09 | Discharge: 2021-07-19 | DRG: 354 | Disposition: A | Discharge status: Skilled Nursing Facility | Payer: Medicare PPO | Attending: Surgery | Admitting: Surgery

## 2021-07-09 DIAGNOSIS — Z856 Personal history of leukemia: Secondary | ICD-10-CM

## 2021-07-09 DIAGNOSIS — Z20822 Contact with and (suspected) exposure to covid-19: Secondary | ICD-10-CM | POA: Diagnosis present

## 2021-07-09 DIAGNOSIS — Z841 Family history of disorders of kidney and ureter: Secondary | ICD-10-CM | POA: Diagnosis not present

## 2021-07-09 DIAGNOSIS — Z820 Family history of epilepsy and other diseases of the nervous system: Secondary | ICD-10-CM

## 2021-07-09 DIAGNOSIS — C9591 Leukemia, unspecified, in remission: Secondary | ICD-10-CM | POA: Diagnosis present

## 2021-07-09 DIAGNOSIS — Z881 Allergy status to other antibiotic agents status: Secondary | ICD-10-CM

## 2021-07-09 DIAGNOSIS — Z86711 Personal history of pulmonary embolism: Secondary | ICD-10-CM

## 2021-07-09 DIAGNOSIS — K420 Umbilical hernia with obstruction, without gangrene: Secondary | ICD-10-CM | POA: Diagnosis present

## 2021-07-09 DIAGNOSIS — Z888 Allergy status to other drugs, medicaments and biological substances status: Secondary | ICD-10-CM

## 2021-07-09 DIAGNOSIS — Z86718 Personal history of other venous thrombosis and embolism: Secondary | ICD-10-CM | POA: Diagnosis not present

## 2021-07-09 DIAGNOSIS — Z7901 Long term (current) use of anticoagulants: Secondary | ICD-10-CM | POA: Diagnosis not present

## 2021-07-09 DIAGNOSIS — N319 Neuromuscular dysfunction of bladder, unspecified: Secondary | ICD-10-CM | POA: Diagnosis present

## 2021-07-09 DIAGNOSIS — K91872 Postprocedural seroma of a digestive system organ or structure following a digestive system procedure: Secondary | ICD-10-CM | POA: Diagnosis not present

## 2021-07-09 DIAGNOSIS — Z6841 Body Mass Index (BMI) 40.0 and over, adult: Secondary | ICD-10-CM

## 2021-07-09 DIAGNOSIS — Z90710 Acquired absence of both cervix and uterus: Secondary | ICD-10-CM | POA: Diagnosis not present

## 2021-07-09 DIAGNOSIS — Y838 Other surgical procedures as the cause of abnormal reaction of the patient, or of later complication, without mention of misadventure at the time of the procedure: Secondary | ICD-10-CM | POA: Diagnosis not present

## 2021-07-09 DIAGNOSIS — Z823 Family history of stroke: Secondary | ICD-10-CM

## 2021-07-09 DIAGNOSIS — K460 Unspecified abdominal hernia with obstruction, without gangrene: Secondary | ICD-10-CM

## 2021-07-09 DIAGNOSIS — G4730 Sleep apnea, unspecified: Secondary | ICD-10-CM | POA: Diagnosis present

## 2021-07-09 DIAGNOSIS — E669 Obesity, unspecified: Secondary | ICD-10-CM | POA: Diagnosis present

## 2021-07-09 DIAGNOSIS — R062 Wheezing: Secondary | ICD-10-CM | POA: Diagnosis not present

## 2021-07-09 DIAGNOSIS — Z8249 Family history of ischemic heart disease and other diseases of the circulatory system: Secondary | ICD-10-CM | POA: Diagnosis not present

## 2021-07-09 DIAGNOSIS — D751 Secondary polycythemia: Secondary | ICD-10-CM | POA: Diagnosis present

## 2021-07-09 DIAGNOSIS — I82439 Acute embolism and thrombosis of unspecified popliteal vein: Secondary | ICD-10-CM | POA: Diagnosis present

## 2021-07-09 DIAGNOSIS — Z87891 Personal history of nicotine dependence: Secondary | ICD-10-CM | POA: Diagnosis not present

## 2021-07-09 DIAGNOSIS — Z91041 Radiographic dye allergy status: Secondary | ICD-10-CM

## 2021-07-09 LAB — RESP PANEL BY RT-PCR (FLU A&B, COVID) ARPGX2
Influenza A by PCR: NEGATIVE
Influenza B by PCR: NEGATIVE
SARS Coronavirus 2 by RT PCR: NEGATIVE

## 2021-07-09 LAB — CBC
HCT: 54.7 % — ABNORMAL HIGH (ref 36.0–46.0)
Hemoglobin: 17.6 g/dL — ABNORMAL HIGH (ref 12.0–15.0)
MCH: 32.4 pg (ref 26.0–34.0)
MCHC: 32.2 g/dL (ref 30.0–36.0)
MCV: 100.6 fL — ABNORMAL HIGH (ref 80.0–100.0)
Platelets: 183 10*3/uL (ref 150–400)
RBC: 5.44 MIL/uL — ABNORMAL HIGH (ref 3.87–5.11)
RDW: 13.3 % (ref 11.5–15.5)
WBC: 13.1 10*3/uL — ABNORMAL HIGH (ref 4.0–10.5)
nRBC: 0 % (ref 0.0–0.2)

## 2021-07-09 LAB — COMPREHENSIVE METABOLIC PANEL
ALT: 18 U/L (ref 0–44)
AST: 16 U/L (ref 15–41)
Albumin: 4 g/dL (ref 3.5–5.0)
Alkaline Phosphatase: 96 U/L (ref 38–126)
Anion gap: 12 (ref 5–15)
BUN: 11 mg/dL (ref 8–23)
CO2: 18 mmol/L — ABNORMAL LOW (ref 22–32)
Calcium: 9 mg/dL (ref 8.9–10.3)
Chloride: 109 mmol/L (ref 98–111)
Creatinine, Ser: 0.75 mg/dL (ref 0.44–1.00)
GFR, Estimated: >60 mL/min (ref >60)
Glucose, Bld: 123 mg/dL — ABNORMAL HIGH (ref 70–99)
Potassium: 3.4 mmol/L — ABNORMAL LOW (ref 3.5–5.1)
Sodium: 139 mmol/L (ref 135–145)
Total Bilirubin: 1.3 mg/dL — ABNORMAL HIGH (ref 0.3–1.2)
Total Protein: 7.4 g/dL (ref 6.5–8.1)

## 2021-07-09 LAB — PROTIME-INR
INR: 1.7 — ABNORMAL HIGH (ref 0.8–1.2)
Prothrombin Time: 19.6 seconds — ABNORMAL HIGH (ref 11.4–15.2)

## 2021-07-09 LAB — LIPASE, BLOOD: Lipase: 27 U/L (ref 11–51)

## 2021-07-09 MED ORDER — MORPHINE SULFATE (PF) 4 MG/ML IV SOLN
4.0000 mg | Freq: Once | INTRAVENOUS | Status: AC
  Administered 2021-07-09: 4 mg via INTRAVENOUS
  Filled 2021-07-09: qty 1

## 2021-07-09 MED ORDER — SODIUM CHLORIDE 0.9 % IV BOLUS
500.0000 mL | Freq: Once | INTRAVENOUS | Status: AC
  Administered 2021-07-09: 500 mL via INTRAVENOUS

## 2021-07-09 MED ORDER — ONDANSETRON HCL 4 MG/2ML IJ SOLN
4.0000 mg | Freq: Once | INTRAMUSCULAR | Status: AC
  Administered 2021-07-09: 4 mg via INTRAVENOUS
  Filled 2021-07-09: qty 2

## 2021-07-09 MED ORDER — SODIUM CHLORIDE 0.9 % IV SOLN
Freq: Once | INTRAVENOUS | Status: AC
Start: 2021-07-09 — End: 2021-07-09

## 2021-07-09 MED ORDER — ONDANSETRON 4 MG PO TBDP
4.0000 mg | ORAL_TABLET | Freq: Once | ORAL | Status: DC
  Filled 2021-07-09: qty 1

## 2021-07-09 NOTE — ED Provider Triage Note (Signed)
Emergency Medicine Provider Triage Evaluation Note  Ana Jones , a 71 y.o. female  was evaluated in triage.  Pt complains of abdominal pain with vomiting and diarrhea for the last 3 days.  Patient states that when she lies down flat there is a large hard ball approximately the size of a cantaloupe in her umbilical region.  She has not had a bowel movement in several days as well.  She denies fever, chills.  Review of Systems  Positive: Abdominal pain, nausea, vomiting, constipation Negative: Urinary symptoms, fever, chills  Physical Exam  BP (!) 158/115 (BP Location: Left Wrist)    Pulse (!) 110    Temp 98.1 F (36.7 C) (Oral)    Resp 16    SpO2 96%  Gen:   Awake, acutely distressed, BP elevated, tachycardic, otherwise nontoxic-appearing Resp:  Normal effort  MSK:   Moves extremities without difficulty  Other:  Abdomen is round.  When laying flat there is a large umbilical hernia that is exquisitely tender to palpation.  Medical Decision Making  Medically screening exam initiated at 7:29 PM.  Appropriate orders placed.  Ana Jones was informed that the remainder of the evaluation will be completed by another provider, this initial triage assessment does not replace that evaluation, and the importance of remaining in the ED until their evaluation is complete.     Tonye Pearson, [place] 07/09/21 2006

## 2021-07-09 NOTE — ED Notes (Signed)
Son updated per pt request.

## 2021-07-09 NOTE — ED Triage Notes (Signed)
Per EMS- Patient c/o abdominal pain x 3-4 days. Patient reports no BM in 4 days. Patient reports vomiting today. Patient also reports that she felt a "cantaloupe ball" in  her abdomen.

## 2021-07-09 NOTE — ED Notes (Addendum)
Per pt request, RN attempted to call son to update him as pt did not feel up to it. No answer. VM left.

## 2021-07-09 NOTE — ED Triage Notes (Signed)
Generalized abdominal pain since Saturday. Feels abdominal bloating, vomiting. LBM on Saturday, hx of issues with constipation

## 2021-07-09 NOTE — H&P (Signed)
Ana Jones is an 71 y.o. female.   Chief Complaint: abd pain HPI: The patient is a 71 year old black female who presents with an umbilical hernia that she has known about for quite some time.  She states that it is been out for over a week.  It was more tender over the last couple days.  She had 1 episode of vomiting today.  She denies any fevers or chills.  She is on Coumadin for pulmonary emboli and she stopped taking her Coumadin couple days ago.  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   Nerve pain    from polio - on Topamax- in legs   Overactive bladder    Polio    PONV (postoperative nausea and vomiting) 1984   pt denies   Pulmonary embolism ([place]) 12/2015   Shortness of breath dyspnea 12/2015   with PE- other times shortness of breath with exertion   Sleep apnea    Urinary incontinence    Wears dentures    Wears glasses     Past Surgical History:  Procedure Laterality Date   ABDOMINAL HYSTERECTOMY     was a vaginal   BACK SURGERY     Lipoma removed   CARPAL TUNNEL RELEASE Right 04/26/2016   pt states not carpel tunnel was broken wrist    COLONOSCOPY W/ POLYPECTOMY     COLONOSCOPY WITH PROPOFOL N/A 05/18/2020   Procedure: COLONOSCOPY WITH PROPOFOL;  Surgeon: Otis Brace, MD;  Location: [facility];  Service: Gastroenterology;  Laterality: N/A;   HARDWARE REMOVAL Right 11/21/2017   Procedure: HARDWARE REMOVAL RIGHT WRIST;  Surgeon: Charlotte Crumb, MD;  Location: [facility];  Service: Orthopedics;  Laterality: Right;   HIP ARTHROSCOPY Right    removed bone spurs   KNEE ARTHROSCOPY Right    x 2   KNEE ARTHROSCOPY Left    x1   KNEE SURGERY Right 08/2014   OPEN REDUCTION  INTERNAL FIXATION (ORIF) DISTAL RADIAL FRACTURE Right 04/26/2016   Procedure: OPEN REDUCTION INTERNAL FIXATION (ORIF) DISTAL RADIAL FRACTURE, right;  Surgeon: Charlotte Crumb, MD;  Location: [facility];  Service: Orthopedics;  Laterality: Right;  Axillary block   POLYPECTOMY  05/18/2020   Procedure: POLYPECTOMY;  Surgeon: Otis Brace, MD;  Location: [facility];  Service: Gastroenterology;;   TUMOR REMOVAL Left 1980's   face   VAGINAL HYSTERECTOMY  1984    Family History  Problem Relation Age of Onset   Heart failure Mother    Heart disease Mother    Alzheimer's disease Mother    Kidney disease Father    Pulmonary embolism Brother    CVA Brother    Social History:  reports that she quit smoking about 9 years ago. Her smoking use included cigarettes. She has never used smokeless tobacco. She reports current alcohol use. She reports that she does not use drugs.  Allergies:  Allergies  Allergen Reactions   Iodides Anaphylaxis    WITH CONTRAST   Iodinated Contrast Media Anaphylaxis, Hives and Swelling    Per pt, happened in 2012    Sulfa Antibiotics Itching, Swelling and Rash   Sulfamethoxazole Itching, Swelling  and Rash    (Not in a hospital admission)   Results for orders placed or performed during the hospital encounter of 07/09/21 (from the past 48 hour(s))  Lipase, blood     Status: None   Collection Time: 07/09/21  7:25 PM  Result Value Ref Range   Lipase 27 11 - 51 U/L    Comment: Performed at [facility], [place] [address], [place], [place] [address]  Comprehensive metabolic panel     Status: Abnormal   Collection Time: 07/09/21  7:25 PM  Result Value Ref Range   Sodium 139 135 - 145 mmol/L   Potassium 3.4 (L) 3.5 - 5.1 mmol/L   Chloride 109 98 - 111 mmol/L   CO2 18 (L) 22 - 32 mmol/L   Glucose, Bld 123 (H) 70 - 99 mg/dL    Comment: Glucose reference range applies only to samples taken after fasting for at least 8 hours.   BUN 11 8 - 23 mg/dL    Creatinine, Ser 0.75 0.44 - 1.00 mg/dL   Calcium 9.0 8.9 - 10.3 mg/dL   Total Protein 7.4 6.5 - 8.1 g/dL   Albumin 4.0 3.5 - 5.0 g/dL   AST 16 15 - 41 U/L   ALT 18 0 - 44 U/L   Alkaline Phosphatase 96 38 - 126 U/L   Total Bilirubin 1.3 (H) 0.3 - 1.2 mg/dL   GFR, Estimated >60 >60 mL/min    Comment: (NOTE) Calculated using the CKD-EPI Creatinine Equation (2021)    Anion gap 12 5 - 15    Comment: Performed at [facility], [place] [address], [place], [place] [address]  CBC     Status: Abnormal   Collection Time: 07/09/21  7:25 PM  Result Value Ref Range   WBC 13.1 (H) 4.0 - 10.5 K/uL   RBC 5.44 (H) 3.87 - 5.11 MIL/uL   Hemoglobin 17.6 (H) 12.0 - 15.0 g/dL   HCT 54.7 (H) 36.0 - 46.0 %   MCV 100.6 (H) 80.0 - 100.0 fL   MCH 32.4 26.0 - 34.0 pg   MCHC 32.2 30.0 - 36.0 g/dL   RDW 13.3 11.5 - 15.5 %   Platelets 183 150 - 400 K/uL   nRBC 0.0 0.0 - 0.2 %    Comment: Performed at [facility], [place] [address], [place], [place] [address]  Resp Panel by RT-PCR (Flu A&B, Covid) Nasopharyngeal Swab     Status: None   Collection Time: 07/09/21  8:25 PM   Specimen: Nasopharyngeal Swab; Nasopharyngeal(NP) swabs in vial transport medium  Result Value Ref Range   SARS Coronavirus 2 by RT PCR NEGATIVE NEGATIVE    Comment: (NOTE) SARS-CoV-2 target nucleic acids are NOT DETECTED.  The SARS-CoV-2 RNA is generally detectable in upper respiratory specimens during the acute phase of infection. The lowest concentration of SARS-CoV-2 viral copies this assay can detect is 138 copies/mL. A negative result does not preclude SARS-Cov-2 infection and should not be used as the sole basis for treatment or other patient management decisions. A negative result may occur with  improper specimen collection/handling, submission of specimen other than nasopharyngeal swab, presence of viral mutation(s) within the areas targeted by this assay, and inadequate number of  viral copies(<138 copies/mL). A negative result must be combined with clinical observations, patient history, and epidemiological information. The expected result is Negative.  Fact Sheet for Patients:  EntrepreneurPulse.com.au  Fact Sheet for Healthcare Providers:  IncredibleEmployment.be  This test is no  t yet approved or cleared by the [place] and  has been authorized for detection and/or diagnosis of SARS-CoV-2 by FDA under an Emergency Use Authorization (EUA). This EUA will remain  in effect (meaning this test can be used) for the duration of the COVID-19 declaration under Section 564(b)(1) of the Act, 21 U.S.C.section 360bbb-3(b)(1), unless the authorization is terminated  or revoked sooner.       Influenza A by PCR NEGATIVE NEGATIVE   Influenza B by PCR NEGATIVE NEGATIVE    Comment: (NOTE) The Xpert Xpress SARS-CoV-2/FLU/RSV plus assay is intended as an aid in the diagnosis of influenza from Nasopharyngeal swab specimens and should not be used as a sole basis for treatment. Nasal washings and aspirates are unacceptable for Xpert Xpress SARS-CoV-2/FLU/RSV testing.  Fact Sheet for Patients: EntrepreneurPulse.com.au  Fact Sheet for Healthcare Providers: IncredibleEmployment.be  This test is not yet approved or cleared by the [place] FDA and has been authorized for detection and/or diagnosis of SARS-CoV-2 by FDA under an Emergency Use Authorization (EUA). This EUA will remain in effect (meaning this test can be used) for the duration of the COVID-19 declaration under Section 564(b)(1) of the Act, 21 U.S.C. section 360bbb-3(b)(1), unless the authorization is terminated or revoked.  Performed at [facility], [place] [address], [place], [place] [address]   Protime-INR     Status: Abnormal   Collection Time: 07/09/21  8:30 PM  Result Value Ref Range   Prothrombin  Time 19.6 (H) 11.4 - 15.2 seconds   INR 1.7 (H) 0.8 - 1.2    Comment: (NOTE) INR goal varies based on device and disease states. Performed at [facility], [place] [address] [place], [place] [address]    CT ABDOMEN PELVIS WO CONTRAST  Result Date: 07/09/2021 CLINICAL DATA:  Generalized abdominal pain for 2 days, bloating, vomiting, umbilical mass EXAM: CT ABDOMEN AND PELVIS WITHOUT CONTRAST TECHNIQUE: Multidetector CT imaging of the abdomen and pelvis was performed following the standard protocol without IV contrast. RADIATION DOSE REDUCTION: This exam was performed according to the departmental dose-optimization program which includes automated exposure control, adjustment of the mA and/or kV according to patient size and/or use of iterative reconstruction technique. COMPARISON:  12/07/2022 FINDINGS: Lower chest: No acute pleural or parenchymal lung disease. Hepatobiliary: Gallbladder is distended, with no evidence of calcified gallstones or cholecystitis. Unenhanced imaging of the liver is unremarkable. Pancreas: Unremarkable unenhanced appearance. Spleen: Unremarkable unenhanced appearance. Adrenals/Urinary Tract: No urinary tract calculi or obstructive uropathy within either kidney. The adrenals are stable. Bladder is minimally distended with no gross abnormality. Stomach/Bowel: There is high-grade obstruction at the level of the proximal transverse colon due to a large umbilical hernia containing a portion of the mid transverse colon. Abrupt transition point at the hernia site. There is diverticulosis of the distal colon with no evidence of acute diverticulitis. Normal appendix right lower quadrant. Vascular/Lymphatic: Aortic atherosclerosis. No enlarged abdominal or pelvic lymph nodes. Reproductive: Status post hysterectomy. No adnexal masses. Other: There is a large umbilical hernia containing a segment of the mid transverse colon, with resulting colonic obstruction. Fat stranding  and fluid within the hernia sac concerning for incarcerated hernia. No bowel wall thickening to suggest bowel ischemia. Surgical consultation is recommended. No other free fluid or free gas. Musculoskeletal: No acute or destructive bony lesions. Reconstructed images demonstrate no additional findings. IMPRESSION: 1. Incarcerated umbilical hernia, containing a segment of the mid transverse colon with resulting high-grade colonic obstruction. No evidence of bowel  wall thickening or bowel ischemia. 2. Distal colonic diverticulosis without diverticulitis. 3. Gallbladder distension, with no evidence of calcified gallstones or acute cholecystitis. 4.  Aortic Atherosclerosis (ICD10-I70.0). Electronically Signed   By: Randa Ngo M.D.   On: 07/09/2021 20:21    Review of Systems  Constitutional:  Positive for appetite change.  HENT: Negative.    Eyes: Negative.   Respiratory: Negative.    Cardiovascular: Negative.   Gastrointestinal:  Positive for abdominal distention, abdominal pain and vomiting.  Endocrine: Negative.   Genitourinary: Negative.   Musculoskeletal: Negative.   Skin: Negative.   Allergic/Immunologic: Negative.   Neurological: Negative.   Hematological: Negative.   Psychiatric/Behavioral: Negative.     Blood pressure (!) 153/97, pulse (!) 108, temperature 98 F (36.7 C), temperature source Oral, resp. rate 18, height 5\' 5"  (1.651 m), weight (!) 158 kg, SpO2 93 %. Physical Exam Constitutional:      General: She is not in acute distress.    Appearance: She is obese.  HENT:     Head: Normocephalic and atraumatic.     Right Ear: External ear normal.     Left Ear: External ear normal.     Nose: Nose normal.     Mouth/Throat:     Mouth: Mucous membranes are moist.     Pharynx: Oropharynx is clear.  Eyes:     General: No scleral icterus.    Extraocular Movements: Extraocular movements intact.     Conjunctiva/sclera: Conjunctivae normal.     Pupils: Pupils are equal, round, and  reactive to light.  Cardiovascular:     Rate and Rhythm: Normal rate and regular rhythm.     Pulses: Normal pulses.     Heart sounds: Normal heart sounds.  Pulmonary:     Effort: Pulmonary effort is normal. No respiratory distress.     Breath sounds: Normal breath sounds.  Abdominal:     Tenderness: There is abdominal tenderness.     Hernia: A hernia is present.     Comments: Obese. Hernia at umbilicus does not reduce although she feels it is much smaller than it was earlier. Has been out for over a week. Only vomited once earlier today  Musculoskeletal:        General: No tenderness or deformity. Normal range of motion.     Cervical back: Normal range of motion and neck supple. No tenderness.  Skin:    General: Skin is warm and dry.     Coloration: Skin is not jaundiced.  Neurological:     General: No focal deficit present.     Mental Status: She is alert and oriented to person, place, and time.  Psychiatric:        Mood and Affect: Mood normal.        Behavior: Behavior normal.        Thought Content: Thought content normal.     Assessment/Plan The patient appears to have an incarcerated umbilical hernia that is causing an element of bowel obstruction.  She has been on Coumadin and her INR is 1.7.  We will admit her to the hospital and get her typed and crossed for some FFP.  She will likely need surgery in the morning to repair the hernia and relieve the obstruction.  We will plan to admit her tonight and get her hydrated and have FFP ready in the morning.  Autumn Messing III, MD 07/09/2021, 11:01 PM

## 2021-07-09 NOTE — ED Notes (Signed)
Patients son would like a call back with an update: Jothanan [phone]

## 2021-07-09 NOTE — ED Provider Notes (Signed)
[facility] DEPT Provider Note   CSN: [id number] Arrival date & time: 07/09/21  1851     History  Chief Complaint  Patient presents with   Abdominal Pain   Emesis    Ana Jones is a 71 y.o. female.  71 year old female with prior medical history as detailed below presents for evaluation.  Patient reports gradual onset of abdominal pain over the last week and a half.  Symptoms have become significantly worse over the last 3 days.  Patient reports no BM in the last 2 days.  She is not passing flatus.  She reports nausea and vomiting.  She can feel a "cantaloupe ball" in her left mid abdomen.  She denies fever.  The history is provided by the patient and medical records.  Abdominal Pain Pain location:  Generalized Pain quality: aching, bloating and cramping   Pain radiates to:  Does not radiate Pain severity:  Moderate Onset quality:  Gradual Duration:  1 week Timing:  Constant Progression:  Worsening Associated symptoms: vomiting   Emesis Associated symptoms: abdominal pain       Home Medications Prior to Admission medications   Medication Sig Start Date End Date Taking? Authorizing Provider  acetaminophen (TYLENOL) 500 MG tablet Take 1,000 mg by mouth 2 (two) times daily as needed for moderate pain or headache.     [provider]  cyclobenzaprine (FLEXERIL) 10 MG tablet Take 10 mg by mouth daily as needed (cramps).     [provider]  oxybutynin (DITROPAN-XL) 10 MG 24 hr tablet Take 10 mg by mouth at bedtime.    [provider]  predniSONE (DELTASONE) 5 MG tablet Take 5 mg by mouth daily with breakfast.    [provider]  topiramate (TOPAMAX) 50 MG tablet Take 50 mg by mouth at bedtime. 03/26/16   [provider]  traMADol (ULTRAM-ER) 100 MG 24 hr tablet Take 100 mg by mouth at bedtime.     [provider]  triamcinolone cream (KENALOG) 0.1 % Apply 1 application topically 2 (two) times  daily as needed for rash. 09/15/17   [provider]  warfarin (COUMADIN) 4 MG tablet Take 4 mg by mouth every evening. Take 4 mg by mouth at night on Tues,Wed,  Thurs, Sat, and Sun.  Take 6 mg by mouth at night on Mon,  Fri 03/26/16   [provider]      Allergies    Iodides, Iodinated contrast media, Sulfa antibiotics, and Sulfamethoxazole    Review of Systems   Review of Systems  Gastrointestinal:  Positive for abdominal pain and vomiting.  All other systems reviewed and are negative.  Physical Exam Updated Vital Signs BP (!) 158/115 (BP Location: Left Wrist)    Pulse (!) 110    Temp 98.1 F (36.7 C) (Oral)    Resp 16    SpO2 96%  Physical Exam Vitals and nursing note reviewed.  Constitutional:      General: She is not in acute distress.    Appearance: Normal appearance. She is well-developed.  HENT:     Head: Normocephalic and atraumatic.  Eyes:     Conjunctiva/sclera: Conjunctivae normal.     Pupils: Pupils are equal, round, and reactive to light.  Cardiovascular:     Rate and Rhythm: Normal rate and regular rhythm.     Heart sounds: Normal heart sounds.  Pulmonary:     Effort: Pulmonary effort is normal. No respiratory distress.     Breath  sounds: Normal breath sounds.  Abdominal:     General: There is distension.     Palpations: Abdomen is soft.     Tenderness: There is no abdominal tenderness.     Comments: Probable firm mass in the left lower quadrant concerning for possible incarcerated hernia  Musculoskeletal:        General: No deformity. Normal range of motion.     Cervical back: Normal range of motion and neck supple.  Skin:    General: Skin is warm and dry.  Neurological:     General: No focal deficit present.     Mental Status: She is alert and oriented to person, place, and time.    ED Results / Procedures / Treatments   Labs (all labs ordered are listed, but only abnormal results are displayed) Labs Reviewed  COMPREHENSIVE  METABOLIC PANEL - Abnormal; Notable for the following components:      Result Value   Potassium 3.4 (*)    CO2 18 (*)    Glucose, Bld 123 (*)    Total Bilirubin 1.3 (*)    All other components within normal limits  CBC - Abnormal; Notable for the following components:   WBC 13.1 (*)    RBC 5.44 (*)    Hemoglobin 17.6 (*)    HCT 54.7 (*)    MCV 100.6 (*)    All other components within normal limits  RESP PANEL BY RT-PCR (FLU A&B, COVID) ARPGX2  LIPASE, BLOOD  URINALYSIS, ROUTINE W REFLEX MICROSCOPIC    EKG None  Radiology No results found.  Procedures Procedures    Medications Ordered in ED Medications  sodium chloride 0.9 % bolus 500 mL (has no administration in time range)  morphine 4 MG/ML injection 4 mg (has no administration in time range)  ondansetron (ZOFRAN) injection 4 mg (has no administration in time range)    ED Course/ Medical Decision Making/ A&P                           Medical Decision Making Amount and/or Complexity of Data Reviewed Labs: ordered.  Risk Prescription drug management.    Medical Screen Complete  This patient presented to the ED with complaint of abdominal pain, abdominal mass.  This complaint involves an extensive number of treatment options. The initial differential diagnosis includes, but is not limited to, incarcerated hernia, bowel obstruction, metabolic abnormality, intra-abdominal infection, etc.  This presentation is: Acute, Previously Undiagnosed, Uncertain Prognosis, Complicated, Systemic Symptoms, and Threat to Life/Bodily Function  Patient presented with abdominal pain, palpable mass in the abdomen.  Exam is concerning for incarcerated hernia.  This is confirmed with CT imaging.  Dr. Marlou Starks of surgery is aware of case and will plan to admit.    Co morbidities that complicated the patient's evaluation  History of DVT/PE on Coumadin   Additional history obtained:  External records from outside sources  obtained and reviewed including prior ED visits and prior Inpatient records.    Lab Tests:  I ordered and personally interpreted labs.  The pertinent results include:  cbc inr cmp covid   Imaging Studies ordered:  I ordered imaging studies including ct ap  I independently visualized and interpreted obtained imaging which showed incarcerated bowel in umbilical hernia I agree with the radiologist interpretation.   Cardiac Monitoring:  The patient was maintained on a cardiac monitor.  I personally viewed and interpreted the cardiac monitor which showed an underlying rhythm of: NSR  Medicines ordered:  I ordered medication including morphine  for pain  Reevaluation of the patient after these medicines showed that the patient: improved    Problem List / ED Course:  Abdominal pain, incarcerated hernia   Reevaluation:  After the interventions noted above, I reevaluated the patient and found that they have: improved     Disposition:  After consideration of the diagnostic results and the patients response to treatment, I feel that the patent would benefit from admission.          Final Clinical Impression(s) / ED Diagnoses Final diagnoses:  None    Rx / DC Orders ED Discharge Orders     None         Valarie Merino, MD 07/09/21 2328

## 2021-07-10 ENCOUNTER — Inpatient Hospital Stay (HOSPITAL_COMMUNITY): Payer: Medicare PPO | Admitting: Anesthesiology

## 2021-07-10 ENCOUNTER — Encounter (HOSPITAL_COMMUNITY): Admission: EM | Disposition: A | Discharge status: Skilled Nursing Facility | Payer: Self-pay | Source: Home / Self Care

## 2021-07-10 ENCOUNTER — Encounter (HOSPITAL_COMMUNITY): Payer: Self-pay

## 2021-07-10 HISTORY — PX: VENTRAL HERNIA REPAIR: SHX424

## 2021-07-10 LAB — BASIC METABOLIC PANEL
Anion gap: 10 (ref 5–15)
BUN: 13 mg/dL (ref 8–23)
CO2: 20 mmol/L — ABNORMAL LOW (ref 22–32)
Calcium: 9 mg/dL (ref 8.9–10.3)
Chloride: 108 mmol/L (ref 98–111)
Creatinine, Ser: 0.78 mg/dL (ref 0.44–1.00)
GFR, Estimated: >60 mL/min (ref >60)
Glucose, Bld: 144 mg/dL — ABNORMAL HIGH (ref 70–99)
Potassium: 3.9 mmol/L (ref 3.5–5.1)
Sodium: 138 mmol/L (ref 135–145)

## 2021-07-10 LAB — CBC
HCT: 53.5 % — ABNORMAL HIGH (ref 36.0–46.0)
Hemoglobin: 17.3 g/dL — ABNORMAL HIGH (ref 12.0–15.0)
MCH: 32.8 pg (ref 26.0–34.0)
MCHC: 32.3 g/dL (ref 30.0–36.0)
MCV: 101.5 fL — ABNORMAL HIGH (ref 80.0–100.0)
Platelets: 171 10*3/uL (ref 150–400)
RBC: 5.27 MIL/uL — ABNORMAL HIGH (ref 3.87–5.11)
RDW: 13.5 % (ref 11.5–15.5)
WBC: 12.7 10*3/uL — ABNORMAL HIGH (ref 4.0–10.5)
nRBC: 0 % (ref 0.0–0.2)

## 2021-07-10 LAB — PROTIME-INR
INR: 1.5 — ABNORMAL HIGH (ref 0.8–1.2)
INR: 1.6 — ABNORMAL HIGH (ref 0.8–1.2)
Prothrombin Time: 18.4 seconds — ABNORMAL HIGH (ref 11.4–15.2)
Prothrombin Time: 18.8 seconds — ABNORMAL HIGH (ref 11.4–15.2)

## 2021-07-10 LAB — HIV ANTIBODY (ROUTINE TESTING W REFLEX): HIV Screen 4th Generation wRfx: NONREACTIVE

## 2021-07-10 LAB — SURGICAL PCR SCREEN
MRSA, PCR: NEGATIVE
Staphylococcus aureus: NEGATIVE

## 2021-07-10 LAB — TYPE AND SCREEN
ABO/RH(D): B POS
Antibody Screen: NEGATIVE

## 2021-07-10 SURGERY — REPAIR, HERNIA, VENTRAL
Anesthesia: General

## 2021-07-10 MED ORDER — MORPHINE SULFATE (PF) 2 MG/ML IV SOLN
1.0000 mg | INTRAVENOUS | Status: DC | PRN
  Administered 2021-07-10 – 2021-07-11 (×4): 2 mg via INTRAVENOUS
  Administered 2021-07-11: 1 mg via INTRAVENOUS
  Administered 2021-07-11 (×4): 2 mg via INTRAVENOUS
  Administered 2021-07-12 (×2): 1 mg via INTRAVENOUS
  Administered 2021-07-13 (×2): 2 mg via INTRAVENOUS
  Administered 2021-07-13: 1 mg via INTRAVENOUS
  Administered 2021-07-13 – 2021-07-14 (×3): 2 mg via INTRAVENOUS
  Filled 2021-07-10 (×18): qty 1

## 2021-07-10 MED ORDER — ONDANSETRON HCL 4 MG/2ML IJ SOLN: 4.0000 mg | Freq: Once | INTRAMUSCULAR | Status: DC | PRN

## 2021-07-10 MED ORDER — SODIUM CHLORIDE 0.9% IV SOLUTION: Freq: Once | INTRAVENOUS | Status: AC

## 2021-07-10 MED ORDER — DEXAMETHASONE SODIUM PHOSPHATE 10 MG/ML IJ SOLN
INTRAMUSCULAR | Status: DC | PRN
  Administered 2021-07-10: 8 mg via INTRAVENOUS

## 2021-07-10 MED ORDER — SUGAMMADEX SODIUM 500 MG/5ML IV SOLN
INTRAVENOUS | Status: DC | PRN
  Administered 2021-07-10: 400 mg via INTRAVENOUS

## 2021-07-10 MED ORDER — ONDANSETRON HCL 4 MG/2ML IJ SOLN
INTRAMUSCULAR | Status: AC
  Filled 2021-07-10: qty 2

## 2021-07-10 MED ORDER — OXYCODONE HCL 5 MG PO TABS: 5.0000 mg | ORAL_TABLET | Freq: Once | ORAL | Status: DC | PRN

## 2021-07-10 MED ORDER — PROPOFOL 10 MG/ML IV BOLUS
INTRAVENOUS | Status: DC | PRN
  Administered 2021-07-10: 180 mg via INTRAVENOUS

## 2021-07-10 MED ORDER — ONDANSETRON HCL 4 MG/2ML IJ SOLN
4.0000 mg | Freq: Four times a day (QID) | INTRAMUSCULAR | Status: DC | PRN
  Administered 2021-07-10: 4 mg via INTRAVENOUS
  Filled 2021-07-10: qty 2

## 2021-07-10 MED ORDER — DEXAMETHASONE SODIUM PHOSPHATE 10 MG/ML IJ SOLN
INTRAMUSCULAR | Status: AC
  Filled 2021-07-10: qty 1

## 2021-07-10 MED ORDER — HYDROMORPHONE HCL 1 MG/ML IJ SOLN
0.5000 mg | INTRAMUSCULAR | Status: AC | PRN
  Administered 2021-07-10 (×2): 0.5 mg via INTRAVENOUS

## 2021-07-10 MED ORDER — ROCURONIUM BROMIDE 10 MG/ML (PF) SYRINGE
PREFILLED_SYRINGE | INTRAVENOUS | Status: DC | PRN
  Administered 2021-07-10: 50 mg via INTRAVENOUS

## 2021-07-10 MED ORDER — ONDANSETRON 4 MG PO TBDP: 4.0000 mg | ORAL_TABLET | Freq: Four times a day (QID) | ORAL | Status: DC | PRN

## 2021-07-10 MED ORDER — CHLORHEXIDINE GLUCONATE 0.12 % MT SOLN
15.0000 mL | Freq: Once | OROMUCOSAL | Status: AC
  Administered 2021-07-10: 15 mL via OROMUCOSAL

## 2021-07-10 MED ORDER — CEFAZOLIN IN SODIUM CHLORIDE 3-0.9 GM/100ML-% IV SOLN
3.0000 g | Freq: Once | INTRAVENOUS | Status: AC
  Administered 2021-07-10: 3 g via INTRAVENOUS
  Filled 2021-07-10: qty 100

## 2021-07-10 MED ORDER — ONDANSETRON HCL 4 MG/2ML IJ SOLN
INTRAMUSCULAR | Status: DC | PRN
  Administered 2021-07-10: 4 mg via INTRAVENOUS

## 2021-07-10 MED ORDER — LIDOCAINE HCL (PF) 2 % IJ SOLN
INTRAMUSCULAR | Status: AC
  Filled 2021-07-10: qty 5

## 2021-07-10 MED ORDER — SODIUM CHLORIDE 0.9% IV SOLUTION: Freq: Once | INTRAVENOUS | Status: DC

## 2021-07-10 MED ORDER — PHENYLEPHRINE 40 MCG/ML (10ML) SYRINGE FOR IV PUSH (FOR BLOOD PRESSURE SUPPORT)
PREFILLED_SYRINGE | INTRAVENOUS | Status: DC | PRN
  Administered 2021-07-10: 80 ug via INTRAVENOUS
  Administered 2021-07-10: 200 ug via INTRAVENOUS

## 2021-07-10 MED ORDER — SUCCINYLCHOLINE CHLORIDE 200 MG/10ML IV SOSY
PREFILLED_SYRINGE | INTRAVENOUS | Status: DC | PRN
  Administered 2021-07-10: 160 mg via INTRAVENOUS

## 2021-07-10 MED ORDER — HYDROMORPHONE HCL 1 MG/ML IJ SOLN
INTRAMUSCULAR | Status: AC
  Filled 2021-07-10: qty 1

## 2021-07-10 MED ORDER — SUGAMMADEX SODIUM 500 MG/5ML IV SOLN
INTRAVENOUS | Status: AC
  Filled 2021-07-10: qty 5

## 2021-07-10 MED ORDER — LACTATED RINGERS IV SOLN: INTRAVENOUS | Status: DC

## 2021-07-10 MED ORDER — PANTOPRAZOLE SODIUM 40 MG IV SOLR
40.0000 mg | Freq: Every day | INTRAVENOUS | Status: DC
Start: 2021-07-10 — End: 2021-07-19
  Administered 2021-07-10 – 2021-07-18 (×10): 40 mg via INTRAVENOUS
  Filled 2021-07-10 (×2): qty 40
  Filled 2021-07-10: qty 10
  Filled 2021-07-10 (×4): qty 40
  Filled 2021-07-10: qty 10
  Filled 2021-07-10 (×2): qty 40

## 2021-07-10 MED ORDER — POTASSIUM CHLORIDE IN NACL 20-0.9 MEQ/L-% IV SOLN
INTRAVENOUS | Status: DC
  Filled 2021-07-10: qty 1000

## 2021-07-10 MED ORDER — HYDROMORPHONE HCL 1 MG/ML IJ SOLN
0.2500 mg | INTRAMUSCULAR | Status: DC | PRN
  Administered 2021-07-10 (×4): 0.5 mg via INTRAVENOUS

## 2021-07-10 MED ORDER — FENTANYL CITRATE (PF) 100 MCG/2ML IJ SOLN
INTRAMUSCULAR | Status: DC | PRN
Start: 2021-07-10 — End: 2021-07-10
  Administered 2021-07-10: 25 ug via INTRAVENOUS
  Administered 2021-07-10: 50 ug via INTRAVENOUS
  Administered 2021-07-10: 25 ug via INTRAVENOUS

## 2021-07-10 MED ORDER — LIDOCAINE 2% (20 MG/ML) 5 ML SYRINGE
INTRAMUSCULAR | Status: DC | PRN
  Administered 2021-07-10: 100 mg via INTRAVENOUS

## 2021-07-10 MED ORDER — AMISULPRIDE (ANTIEMETIC) 5 MG/2ML IV SOLN: 10.0000 mg | Freq: Once | INTRAVENOUS | Status: DC | PRN

## 2021-07-10 MED ORDER — OXYCODONE HCL 5 MG/5ML PO SOLN: 5.0000 mg | Freq: Once | ORAL | Status: DC | PRN

## 2021-07-10 MED ORDER — FENTANYL CITRATE (PF) 100 MCG/2ML IJ SOLN
INTRAMUSCULAR | Status: AC
  Filled 2021-07-10: qty 2

## 2021-07-10 MED ORDER — SUCCINYLCHOLINE CHLORIDE 200 MG/10ML IV SOSY
PREFILLED_SYRINGE | INTRAVENOUS | Status: AC
  Filled 2021-07-10: qty 10

## 2021-07-10 SURGICAL SUPPLY — 35 items
BAG COUNTER SPONGE SURGICOUNT (BAG) IMPLANT
BINDER ABDOMINAL 12 ML 46-62 (SOFTGOODS) ×1 IMPLANT
BLADE HEX COATED 2.75 (ELECTRODE) ×2 IMPLANT
CHLORAPREP W/TINT 26 (MISCELLANEOUS) ×2 IMPLANT
COVER SURGICAL LIGHT HANDLE (MISCELLANEOUS) ×2 IMPLANT
DECANTER SPIKE VIAL GLASS SM (MISCELLANEOUS) IMPLANT
DERMABOND ADVANCED (GAUZE/BANDAGES/DRESSINGS) ×1
DERMABOND ADVANCED .7 DNX12 (GAUZE/BANDAGES/DRESSINGS) IMPLANT
DRAIN CHANNEL 10F 3/8 F FF (DRAIN) IMPLANT
DRAIN CHANNEL RND F F (WOUND CARE) IMPLANT
DRAPE LAPAROSCOPIC ABDOMINAL (DRAPES) ×2 IMPLANT
ELECT REM PT RETURN 15FT ADLT (MISCELLANEOUS) ×2 IMPLANT
EVACUATOR SILICONE 100CC (DRAIN) IMPLANT
GAUZE SPONGE 4X4 12PLY STRL (GAUZE/BANDAGES/DRESSINGS) ×2 IMPLANT
GLOVE SURG ENC TEXT LTX SZ8 (GLOVE) ×2 IMPLANT
GLOVE SURG UNDER POLY LF SZ7 (GLOVE) ×2 IMPLANT
GOWN SPEC L4 XLG W/TWL (GOWN DISPOSABLE) ×2 IMPLANT
GOWN STRL REUS W/TWL LRG LVL3 (GOWN DISPOSABLE) ×2 IMPLANT
GOWN STRL REUS W/TWL XL LVL3 (GOWN DISPOSABLE) ×6 IMPLANT
KIT BASIN OR (CUSTOM PROCEDURE TRAY) ×2 IMPLANT
KIT TURNOVER KIT A (KITS) IMPLANT
NEEDLE HYPO 22GX1.5 SAFETY (NEEDLE) IMPLANT
NS IRRIG 1000ML POUR BTL (IV SOLUTION) ×2 IMPLANT
PACK GENERAL/GYN (CUSTOM PROCEDURE TRAY) ×2 IMPLANT
STAPLER VISISTAT 35W (STAPLE) ×2 IMPLANT
SUT ETHILON 3 0 PS 1 (SUTURE) IMPLANT
SUT MNCRL AB 4-0 PS2 18 (SUTURE) ×1 IMPLANT
SUT PDS AB 1 CTX 36 (SUTURE) IMPLANT
SUT PROLENE 0 CT 1 CR/8 (SUTURE) ×2 IMPLANT
SUT STRAFIX PDS 18 CTX (SUTURE) ×2 IMPLANT
SUT VIC AB 2-0 CT1 18 (SUTURE) ×2 IMPLANT
SUT VIC AB 4-0 SH 18 (SUTURE) IMPLANT
SYR CONTROL 10ML LL (SYRINGE) IMPLANT
TRAY FOLEY MTR SLVR 14FR STAT (SET/KITS/TRAYS/PACK) ×1 IMPLANT
TRAY FOLEY MTR SLVR 16FR STAT (SET/KITS/TRAYS/PACK) ×1 IMPLANT

## 2021-07-10 NOTE — Progress Notes (Signed)
Incarcerated 4cm x 4cm umbilical hernia.  I recommended emergent hernia repair.  FFP has been given to reverse the patient's coumadin.  We will proceed emergently to the OR now she has been sufficiently reversed.  I discussed the surgery itself as well as its risk, benefits, and alternatives with the patient.  I explained I usually like to repair hernias with a mesh to prevent recurrence, however in this emergent situation and with her bleeding risk related to Coumadin and wound healing risks, reduction of the hernia with primary closure of the fascia may be the best option depending on intraoperative findings.  She voiced understanding and consent to proceed.    Felicie Morn, MD General, Bariatric and Minimally Invasive Surgery [facility], [place]

## 2021-07-10 NOTE — TOC CM/SW Note (Signed)
°  Transition of Care [facility]) Screening Note   Patient Details  Name: Ana Jones Date of Birth: April 09, 1951   Transition of Care [facility]) CM/SW Contact:    Ross Ludwig, LCSW Phone Number: 07/10/2021, 6:56 PM    Transition of Care Department [facility]) has reviewed patient and no TOC needs have been identified at this time. We will continue to monitor patient advancement through interdisciplinary progression rounds. If new patient transition needs arise, please place a TOC consult.

## 2021-07-10 NOTE — Transfer of Care (Signed)
Immediate Anesthesia Transfer of Care Note  Patient: Ana Jones  Procedure(s) Performed: HERNIA REPAIR VENTRAL ADULT  Patient Location: PACU  Anesthesia Type:General  Level of Consciousness: sedated  Airway & Oxygen Therapy: Patient Spontanous Breathing and Patient connected to face mask oxygen  Post-op Assessment: Report given to RN and Post -op Vital signs reviewed and stable  Post vital signs: Reviewed and stable  Last Vitals:  Vitals Value Taken Time  BP 137/108 07/10/21 1736  Temp    Pulse 101 07/10/21 1739  Resp 15 07/10/21 1739  SpO2 98 % 07/10/21 1739  Vitals shown include unvalidated device data.  Last Pain:  Vitals:   07/10/21 1039  TempSrc:   PainSc: 5       Patients Stated Pain Goal: 0 (10/71/25 2479)  Complications: No notable events documented.

## 2021-07-10 NOTE — Op Note (Signed)
° °  Patient: Ana Jones ([birth year], [id number])  Date of Surgery: 07/10/2021   Preoperative Diagnosis: 4 cm x 4 cm incarcerated umbilical ventral hernia   Postoperative Diagnosis: 4 cm x 4 cm incarcerated umbilical ventral hernia   Surgical Procedure: 4cm x 4 cm PRIMARY VENTRAL UMBILICAL HERNIA REPAIR WITH #1 STRATAFIX BARBED SUTURE  Operative Team Members:  Surgeon(s) and Role:    * Keagen Heinlen, Nickola Major, MD - Primary   Anesthesiologist: Santa Lighter, MD CRNA: Lind Covert, CRNA   Anesthesia: General   Fluids:  Total I/O In: 1548.1 [I.V.:1000; Blood:548.1] Out: 500 [Urine:400; SHFWY:637]  Complications: None  Drains:  none   Specimen: None  Disposition:  PACU - hemodynamically stable.  Plan of Care: Continued inpatient care    Indications for Procedure: [name] is a 71 y.o. female who presented with an incarcerated  Findings:  Hernia Location: Umbilical Hernia Size:  4cm x 4cm  Mesh Size &Type:  None - sutured repair with #1 Stratafix suture Mesh Position: n/a  Description of Procedure:  The patient was positioned supine, padded and secured to the bed.  The abdomen was widely prepped and draped.  A time out procedure was performed.   A curvilinear incision was made to the left of the umbilicus where the skin was thinned.  Dissection was carried down through the subcutaneous tissue to the level of the fascia.  The umbilical stalk was injured during this disection so the umbilical scar was resected and passed off the field.  The hernia defect was measured as 4 cm wide by 4 cm in vertical dimension.    The hernia sac was opened.  It contained viable incarcerated colon, fatty tissue and inflammatory fluid.  With some difficulty, the colon was returned to the abdomen.  I was unable to inspect the abdomen due to the small hernia defect.   A sutured umbilical hernia repair was performed utilizing two running #1 stratafix sutures. The defect was closed  horizontally under no tension.  The wound was irrigated with saline.  The deep dermal tissues were closed with 2-0 vicryl suture.  The skin was closed with 4-0 Monocryl subcuticular suture and skin glue.     Louanna Raw, MD General, Bariatric, & Minimally Invasive Surgery [facility], [place]

## 2021-07-10 NOTE — Progress Notes (Signed)
Noted a new order for 1 unit plasma that was put in epic about 1 hour ago.  Called and spoke with Dr.Finucane to inquire if this needed to be given now before surgery.  She states no, it can just be on hold for surgery if its needed.  Patient received 2 units plasma already this morning on 3 east.

## 2021-07-10 NOTE — Progress Notes (Signed)
Called patients son, Roderic Palau and informed him of patients surgery start delay.  Informed him surgery probably won't start now til at least 1600 at the earliest.  Explained to him that a more emergent case had to be done in front of his mom. He voiced understanding.  Informed patient that Dr. Thermon Leyland was in surgery and when she sees him, her surgery will start soon after.  Patient knodded with understanding.

## 2021-07-10 NOTE — Anesthesia Procedure Notes (Signed)
Procedure Name: Intubation Date/Time: 07/10/2021 4:13 PM Performed by: Lind Covert, CRNA Pre-anesthesia Checklist: Patient identified, Emergency Drugs available, Suction available and Patient being monitored Patient Re-evaluated:Patient Re-evaluated prior to induction Oxygen Delivery Method: Circle system utilized Preoxygenation: Pre-oxygenation with 100% oxygen Induction Type: IV induction and Rapid sequence Laryngoscope Size: Mac and 4 Grade View: Grade I Tube type: Oral Tube size: 7.0 mm Number of attempts: 1 Airway Equipment and Method: Stylet Placement Confirmation: ETT inserted through vocal cords under direct vision, positive ETCO2 and breath sounds checked- equal and bilateral Secured at: 22 cm Tube secured with: Tape Dental Injury: Teeth and Oropharynx as per pre-operative assessment

## 2021-07-10 NOTE — Progress Notes (Signed)
Patient informed of new surgery time of 1430 due to an emergent case that needed to go first.  Informed patient she would stay in short stay and she would be next. Patient knodded with understanding.

## 2021-07-10 NOTE — Anesthesia Preprocedure Evaluation (Addendum)
Anesthesia Evaluation  Patient identified by MRN, date of birth, ID band Patient awake    Reviewed: Allergy & Precautions, NPO status , Patient's Chart, lab work & pertinent test results  History of Anesthesia Complications (+) PONV and history of anesthetic complications (remote hx PONV)  Airway Mallampati: II  TM Distance: >3 FB Neck ROM: Full    Dental  (+) Missing, Dental Advisory Given   Pulmonary shortness of breath and with exertion, sleep apnea (pt denies every being given CPAP but sleeps in seated position, denies snoring) , former smoker, PE (2017) Quit smoking 2013   Pulmonary exam normal breath sounds clear to auscultation       Cardiovascular + DVT (2017 coumadin last dose 1/27)  Normal cardiovascular exam Rhythm:Regular Rate:Normal     Neuro/Psych  Headaches, negative psych ROS   GI/Hepatic negative GI ROS, Neg liver ROS,   Endo/Other  Morbid obesityBMI 79  Renal/GU negative Renal ROS Bladder dysfunction      Musculoskeletal  (+) Arthritis , Osteoarthritis,    Abdominal   Peds  Hematology  (+) Blood dyscrasia, , 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   Anesthesia Other Findings Ventral hernia   Reproductive/Obstetrics negative OB ROS                          Anesthesia Physical Anesthesia Plan  ASA: 4  Anesthesia Plan: General   Post-op Pain Management: Tylenol PO (pre-op)   Induction: Intravenous  PONV Risk Score and Plan: 4 or greater and Ondansetron, Dexamethasone and Treatment may vary due to age or medical condition  Airway Management Planned: Oral ETT  Additional Equipment: None  Intra-op Plan:   Post-operative Plan: Extubation in OR  Informed Consent: I have reviewed the patients History and Physical, chart, labs and discussed the procedure including the risks, benefits and alternatives for the proposed anesthesia with the patient or authorized representative who has  indicated his/her understanding and acceptance.     Dental advisory given  Plan Discussed with: CRNA  Anesthesia Plan Comments: (Super morbid obesity- high risk from pulmonary standpoint Got FFP for INR of 1.5 )       Anesthesia Quick Evaluation

## 2021-07-11 ENCOUNTER — Encounter (HOSPITAL_COMMUNITY): Payer: Self-pay | Admitting: Surgery

## 2021-07-11 LAB — CBC
HCT: 48.2 % — ABNORMAL HIGH (ref 36.0–46.0)
Hemoglobin: 15.4 g/dL — ABNORMAL HIGH (ref 12.0–15.0)
MCH: 32.9 pg (ref 26.0–34.0)
MCHC: 32 g/dL (ref 30.0–36.0)
MCV: 103 fL — ABNORMAL HIGH (ref 80.0–100.0)
Platelets: 166 10*3/uL (ref 150–400)
RBC: 4.68 MIL/uL (ref 3.87–5.11)
RDW: 13.5 % (ref 11.5–15.5)
WBC: 12.7 10*3/uL — ABNORMAL HIGH (ref 4.0–10.5)
nRBC: 0 % (ref 0.0–0.2)

## 2021-07-11 LAB — BPAM FFP
Blood Product Expiration Date: 202302052359
Blood Product Expiration Date: 202302052359
ISSUE DATE / TIME: 202301310539
ISSUE DATE / TIME: 202301310857
Unit Type and Rh: 1700
Unit Type and Rh: 7300

## 2021-07-11 LAB — BASIC METABOLIC PANEL
Anion gap: 9 (ref 5–15)
BUN: 17 mg/dL (ref 8–23)
CO2: 22 mmol/L (ref 22–32)
Calcium: 8.5 mg/dL — ABNORMAL LOW (ref 8.9–10.3)
Chloride: 107 mmol/L (ref 98–111)
Creatinine, Ser: 0.81 mg/dL (ref 0.44–1.00)
GFR, Estimated: >60 mL/min (ref >60)
Glucose, Bld: 133 mg/dL — ABNORMAL HIGH (ref 70–99)
Potassium: 4.3 mmol/L (ref 3.5–5.1)
Sodium: 138 mmol/L (ref 135–145)

## 2021-07-11 LAB — PROTIME-INR
INR: 1.7 — ABNORMAL HIGH (ref 0.8–1.2)
Prothrombin Time: 19.6 seconds — ABNORMAL HIGH (ref 11.4–15.2)

## 2021-07-11 LAB — PREPARE FRESH FROZEN PLASMA

## 2021-07-11 MED ORDER — MAGNESIUM OXIDE -MG SUPPLEMENT 400 (240 MG) MG PO TABS: 200.0000 mg | ORAL_TABLET | Freq: Every day | ORAL | Status: DC | PRN

## 2021-07-11 MED ORDER — CYCLOBENZAPRINE HCL 10 MG PO TABS
10.0000 mg | ORAL_TABLET | Freq: Three times a day (TID) | ORAL | Status: DC | PRN
  Administered 2021-07-14: 10 mg via ORAL
  Filled 2021-07-11: qty 1

## 2021-07-11 MED ORDER — TRAMADOL HCL 50 MG PO TABS
100.0000 mg | ORAL_TABLET | Freq: Every day | ORAL | Status: DC
  Administered 2021-07-11: 100 mg via ORAL
  Filled 2021-07-11: qty 2

## 2021-07-11 MED ORDER — ACETAMINOPHEN 500 MG PO TABS: 1000.0000 mg | ORAL_TABLET | Freq: Every morning | ORAL | Status: DC

## 2021-07-11 MED ORDER — ACETAMINOPHEN 500 MG PO TABS: 1000.0000 mg | ORAL_TABLET | Freq: Three times a day (TID) | ORAL | Status: DC

## 2021-07-11 MED ORDER — CYCLOBENZAPRINE HCL 10 MG PO TABS: 10.0000 mg | ORAL_TABLET | Freq: Every day | ORAL | Status: DC | PRN

## 2021-07-11 MED ORDER — ACETAMINOPHEN 500 MG PO TABS
1000.0000 mg | ORAL_TABLET | Freq: Three times a day (TID) | ORAL | Status: DC
  Administered 2021-07-11 – 2021-07-13 (×2): 1000 mg via ORAL
  Filled 2021-07-11 (×5): qty 2

## 2021-07-11 MED ORDER — TOPIRAMATE 25 MG PO TABS
50.0000 mg | ORAL_TABLET | Freq: Every day | ORAL | Status: DC
  Administered 2021-07-11 – 2021-07-18 (×8): 50 mg via ORAL
  Filled 2021-07-11 (×8): qty 2

## 2021-07-11 MED ORDER — CHLORHEXIDINE GLUCONATE CLOTH 2 % EX PADS
6.0000 | MEDICATED_PAD | Freq: Every day | CUTANEOUS | Status: DC
  Administered 2021-07-13 – 2021-07-14 (×2): 6 via TOPICAL

## 2021-07-11 MED ORDER — OXYCODONE HCL 5 MG PO TABS
5.0000 mg | ORAL_TABLET | ORAL | Status: DC | PRN
  Administered 2021-07-11 – 2021-07-12 (×2): 5 mg via ORAL
  Filled 2021-07-11 (×3): qty 1

## 2021-07-11 MED ORDER — ACETAMINOPHEN 500 MG PO TABS
1000.0000 mg | ORAL_TABLET | Freq: Once | ORAL | Status: AC
  Administered 2021-07-11: 1000 mg via ORAL
  Filled 2021-07-11: qty 2

## 2021-07-11 MED ORDER — SALINE SPRAY 0.65 % NA SOLN
1.0000 | NASAL | Status: DC | PRN
  Administered 2021-07-11: 1 via NASAL
  Filled 2021-07-11: qty 44

## 2021-07-11 NOTE — Anesthesia Postprocedure Evaluation (Signed)
Anesthesia Post Note  Patient: Ana Jones  Procedure(s) Performed: HERNIA REPAIR VENTRAL ADULT     Patient location during evaluation: PACU Anesthesia Type: General Level of consciousness: awake and alert Pain management: pain level controlled Vital Signs Assessment: post-procedure vital signs reviewed and stable Respiratory status: spontaneous breathing, nonlabored ventilation, respiratory function stable and patient connected to nasal cannula oxygen Cardiovascular status: blood pressure returned to baseline and stable Postop Assessment: no apparent nausea or vomiting Anesthetic complications: no   No notable events documented.  Last Vitals:  Vitals:   07/11/21 0905 07/11/21 1224  BP: (!) 144/88 127/67  Pulse: 84 97  Resp: 18 18  Temp: 36.6 C 36.5 C  SpO2: 95% 96%    Last Pain:  Vitals:   07/11/21 1813  TempSrc:   PainSc: 10-Worst pain ever                 Santa Lighter

## 2021-07-11 NOTE — Progress Notes (Signed)
Progress Note  1 Day Post-Op  Subjective: Patient irritated that people keep coming in her room. She reports incisional pain but the binder helps with this. We discussed importance of mobilization today, patient was resistant to this. Patient is tolerating CLD without n/v, but no flatus yet.   Objective: Vital signs in last 24 hours: Temp:  [97.4 F (36.3 C)-99 F (37.2 C)] 97.8 F (36.6 C) (02/01 0905) Pulse Rate:  [53-102] 84 (02/01 0905) Resp:  [11-23] 18 (02/01 0905) BP: (133-166)/(78-125) 144/88 (02/01 0905) SpO2:  [85 %-99 %] 95 % (02/01 0905) Weight:  [158 kg] 158 kg (01/31 1041) Last BM Date: 07/07/21  Intake/Output from previous day: 01/31 0701 - 02/01 0700 In: 2028.1 [P.O.:480; I.V.:1000; Blood:548.1] Out: 68 [Urine:645; Blood:100] Intake/Output this shift: Total I/O In: -  Out: 71 [Urine:85]  PE: General: WD, morbidly obese female who is laying in bed in NAD Heart: regular, rate, and rhythm.   Lungs: CTAB, no wheezes, rhonchi, or rales noted.  Respiratory effort nonlabored Abd: soft, appropriately ttp, incision C/D/I with some mild bruising around, unable to auscultate BS secondary to body habitus.  Psych: A&Ox3 with an appropriate affect.    Lab Results:  Recent Labs    07/10/21 0127 07/11/21 0803  WBC 12.7* 12.7*  HGB 17.3* 15.4*  HCT 53.5* 48.2*  PLT 171 166   BMET Recent Labs    07/10/21 0127 07/11/21 0803  NA 138 138  K 3.9 4.3  CL 108 107  CO2 20* 22  GLUCOSE 144* 133*  BUN 13 17  CREATININE 0.78 0.81  CALCIUM 9.0 8.5*   PT/INR Recent Labs    07/10/21 0823 07/10/21 1954  LABPROT 18.4* 18.8*  INR 1.5* 1.6*   CMP     Component Value Date/Time   NA 138 07/11/2021 0803   NA 141 12/15/2015 0000   K 4.3 07/11/2021 0803   CL 107 07/11/2021 0803   CO2 22 07/11/2021 0803   GLUCOSE 133 (H) 07/11/2021 0803   BUN 17 07/11/2021 0803   BUN 11 12/15/2015 0000   CREATININE 0.81 07/11/2021 0803   CALCIUM 8.5 (L) 07/11/2021 0803    PROT 7.4 07/09/2021 1925   ALBUMIN 4.0 07/09/2021 1925   AST 16 07/09/2021 1925   ALT 18 07/09/2021 1925   ALKPHOS 96 07/09/2021 1925   BILITOT 1.3 (H) 07/09/2021 1925   GFRNONAA >60 07/11/2021 0803   GFRAA >60 11/18/2017 1438   Lipase     Component Value Date/Time   LIPASE 27 07/09/2021 1925       Studies/Results: CT ABDOMEN PELVIS WO CONTRAST  Result Date: 07/09/2021 CLINICAL DATA:  Generalized abdominal pain for 2 days, bloating, vomiting, umbilical mass EXAM: CT ABDOMEN AND PELVIS WITHOUT CONTRAST TECHNIQUE: Multidetector CT imaging of the abdomen and pelvis was performed following the standard protocol without IV contrast. RADIATION DOSE REDUCTION: This exam was performed according to the departmental dose-optimization program which includes automated exposure control, adjustment of the mA and/or kV according to patient size and/or use of iterative reconstruction technique. COMPARISON:  12/07/2022 FINDINGS: Lower chest: No acute pleural or parenchymal lung disease. Hepatobiliary: Gallbladder is distended, with no evidence of calcified gallstones or cholecystitis. Unenhanced imaging of the liver is unremarkable. Pancreas: Unremarkable unenhanced appearance. Spleen: Unremarkable unenhanced appearance. Adrenals/Urinary Tract: No urinary tract calculi or obstructive uropathy within either kidney. The adrenals are stable. Bladder is minimally distended with no gross abnormality. Stomach/Bowel: There is high-grade obstruction at the level of the proximal transverse colon due  to a large umbilical hernia containing a portion of the mid transverse colon. Abrupt transition point at the hernia site. There is diverticulosis of the distal colon with no evidence of acute diverticulitis. Normal appendix right lower quadrant. Vascular/Lymphatic: Aortic atherosclerosis. No enlarged abdominal or pelvic lymph nodes. Reproductive: Status post hysterectomy. No adnexal masses. Other: There is a large umbilical  hernia containing a segment of the mid transverse colon, with resulting colonic obstruction. Fat stranding and fluid within the hernia sac concerning for incarcerated hernia. No bowel wall thickening to suggest bowel ischemia. Surgical consultation is recommended. No other free fluid or free gas. Musculoskeletal: No acute or destructive bony lesions. Reconstructed images demonstrate no additional findings. IMPRESSION: 1. Incarcerated umbilical hernia, containing a segment of the mid transverse colon with resulting high-grade colonic obstruction. No evidence of bowel wall thickening or bowel ischemia. 2. Distal colonic diverticulosis without diverticulitis. 3. Gallbladder distension, with no evidence of calcified gallstones or acute cholecystitis. 4.  Aortic Atherosclerosis (ICD10-I70.0). Electronically Signed   By: Randa Ngo M.D.   On: 07/09/2021 20:21    Anti-infectives: Anti-infectives (From admission, onward)    Start     Dose/Rate Route Frequency Ordered Stop   07/10/21 1600  ceFAZolin (ANCEF) IVPB 3g/100 mL premix        3 g 200 mL/hr over 30 Minutes Intravenous  Once 07/10/21 1556 07/10/21 1615        Assessment/Plan POD1 s/p ventral hernia repair 07/10/21 Dr. Thermon Leyland - AROBF. Keep on CLD for now - ok to SLIV since PO intake is good and Cr stable - foley removed this AM at bedside - needs to mobilize - continue binder   FEN: CLD, SLIV VTE: SCDs, hold chemical prophylaxis for now ID: Ancef pre-op  Hx of DVT/PE on warfarin - discussed with MD will start heparin gtt tomorrow AM and work towards getting back on warfarin when tolerating FLD Leukemia - in remission  Arthritis    LOS: 2 days   I reviewed last 24 h vitals and pain scores, last 48 h intake and output, and last 24 h labs and trends.  This care required moderate level of medical decision making.    Norm Parcel, [facility] 07/11/2021, 9:58 AM Please see Amion for pager number during day  hours 7:00am-4:30pm

## 2021-07-11 NOTE — Progress Notes (Signed)
°   07/11/21 1300  Mobility  Activity Refused mobility   Pt refused, stated "want to finish my nap" and was dozing off during conversation.   [place] Specialist Acute Rehab Services Office: [phone]

## 2021-07-11 NOTE — Progress Notes (Signed)
PT Cancellation Note  Patient Details Name: Ana Jones MRN: [id number] DOB: Mar 10, 1951   Cancelled Treatment:    Reason Eval/Treat Not Completed: Patient declined, "Just leave me alone."  Tresa Endo PT Acute Rehabilitation Services Pager [phone] Office [phone]   Ana Jones 07/11/2021, 2:55 PM

## 2021-07-12 LAB — BASIC METABOLIC PANEL
Anion gap: 6 (ref 5–15)
BUN: 19 mg/dL (ref 8–23)
CO2: 24 mmol/L (ref 22–32)
Calcium: 8.4 mg/dL — ABNORMAL LOW (ref 8.9–10.3)
Chloride: 107 mmol/L (ref 98–111)
Creatinine, Ser: 0.84 mg/dL (ref 0.44–1.00)
GFR, Estimated: >60 mL/min (ref >60)
Glucose, Bld: 107 mg/dL — ABNORMAL HIGH (ref 70–99)
Potassium: 3.7 mmol/L (ref 3.5–5.1)
Sodium: 137 mmol/L (ref 135–145)

## 2021-07-12 LAB — CBC
HCT: 45.2 % (ref 36.0–46.0)
Hemoglobin: 14.4 g/dL (ref 12.0–15.0)
MCH: 32.7 pg (ref 26.0–34.0)
MCHC: 31.9 g/dL (ref 30.0–36.0)
MCV: 102.5 fL — ABNORMAL HIGH (ref 80.0–100.0)
Platelets: 155 10*3/uL (ref 150–400)
RBC: 4.41 MIL/uL (ref 3.87–5.11)
RDW: 13.3 % (ref 11.5–15.5)
WBC: 10.4 10*3/uL (ref 4.0–10.5)
nRBC: 0 % (ref 0.0–0.2)

## 2021-07-12 LAB — APTT: aPTT: 32 seconds (ref 24–36)

## 2021-07-12 LAB — HEPARIN LEVEL (UNFRACTIONATED)
Heparin Unfractionated: <0.1 IU/mL — ABNORMAL LOW (ref 0.30–0.70)
Heparin Unfractionated: <0.1 IU/mL — ABNORMAL LOW (ref 0.30–0.70)

## 2021-07-12 LAB — PROTIME-INR
INR: 1.7 — ABNORMAL HIGH (ref 0.8–1.2)
Prothrombin Time: 19.8 seconds — ABNORMAL HIGH (ref 11.4–15.2)

## 2021-07-12 MED ORDER — HEPARIN (PORCINE) 25000 UT/250ML-% IV SOLN
1550.0000 [IU]/h | INTRAVENOUS | Status: DC
  Administered 2021-07-12: 1550 [IU]/h via INTRAVENOUS
  Filled 2021-07-12: qty 250

## 2021-07-12 MED ORDER — HEPARIN (PORCINE) 25000 UT/250ML-% IV SOLN
2200.0000 [IU]/h | INTRAVENOUS | Status: DC
  Administered 2021-07-12: 1900 [IU]/h via INTRAVENOUS
  Administered 2021-07-13: 2200 [IU]/h via INTRAVENOUS
  Filled 2021-07-12 (×3): qty 250

## 2021-07-12 MED ORDER — TRAMADOL HCL 50 MG PO TABS
50.0000 mg | ORAL_TABLET | Freq: Four times a day (QID) | ORAL | Status: DC | PRN
  Administered 2021-07-12: 100 mg via ORAL
  Filled 2021-07-12: qty 2

## 2021-07-12 NOTE — Progress Notes (Signed)
Occupational Therapy Evaluation  Patient lives alone in a town house, does not have support of family/neighbors here in [place]. At baseline she is mod I with self care and has housekeeper to assist with IADL tasks. Currently patient needing set up to min A for UB ADL and max A for LB ADLs due to acute pain, decreased activity tolerance and safety awareness. Due to limited support at discharge and increased needs for safely completing ADL tasks would recommend rehab prior to return home, patient is hopeful to go to rehab at [facility]. Acute OT to follow.    07/12/21 1000  OT Visit Information  Last OT Received On 07/12/21  Assistance Needed +2 (safety/chair follow)  PT/OT/SLP Co-Evaluation/Treatment Yes  Reason for Co-Treatment For patient/therapist safety;To address functional/ADL transfers  PT goals addressed during session Mobility/safety with mobility  OT goals addressed during session ADL's and self-care  History of Present Illness Patient is a 71 year old  female who presents with an umbilical hernia , S/PHERNIA REPAIR VENTRAL on 07/10/2021. PNT:[id number], DVT, PE, polio, sleep apnea, OA.  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  Restrictions  Weight Bearing Restrictions No  Home Living  Family/patient expects to be discharged to: Private residence  Living Arrangements Alone  Available Help at Discharge Other (Comment) (None)  Type of [place] to enter  Entrance Stairs-Number of Steps small "half steps" onto porch then into [place] One level  Bathroom Shower/Tub Tub/shower unit  [place] Tub bench;Rollator (4 wheels);Grab bars - tub/shower;Cane - single point;Adaptive equipment  Adaptive Equipment Reacher;Sock aid  Additional Comments Patient goes between homes in [place] and townhouse in [place]  Prior Function  Prior Level of Function   Independent/Modified Independent  Mobility Comments uses cane to get to/from her car but uses rollator in the house and keeps another rollator in her car  ADLs Comments Patient performs BADLs herself, has housekeeper for IADLs such as Educational psychologist No difficulties  Pain Assessment  Pain Assessment Faces  Faces Pain Scale 4  Pain Location abdomen,  Pain Descriptors / Indicators Discomfort  Pain Intervention(s) Monitored during session  Cognition  Arousal/Alertness Awake/alert  Behavior During Therapy WFL for tasks assessed/performed  Overall Cognitive Status Within Functional Limits for tasks assessed  Upper Extremity Assessment  Upper Extremity Assessment Overall WFL for tasks assessed  Lower Extremity Assessment  Lower Extremity Assessment Defer to PT evaluation  Cervical / Trunk Assessment  Cervical / Trunk Assessment Other exceptions  Cervical / Trunk Exceptions body habitus  ADL  Overall ADL's  Needs assistance/impaired  Eating/Feeding Independent  Grooming Set up;Sitting  Upper Body Bathing Minimal assistance;Sitting  Lower Body Bathing Maximal assistance;Sitting/lateral leans;Sit to/from stand  Upper Body Dressing  Minimal assistance;Sitting  Lower Body Dressing Maximal assistance;Sitting/lateral leans;Sit to/from stand  Lower Body Dressing Details (indicate cue type and reason) Patient does not wear socks, anticipate increased difficulty with LB dressing due to abdominal discomfort, body habitus and limited activity tolerance. Does have reacher at home  Toilet Transfer Min guard;+2 for safety/equipment;Ambulation;Rollator (4 wheels)  Toilet Transfer Details (indicate cue type and reason) Patient utilize momentum to power up to standing. Becomes short of breath quickly and reporting dizziness. Also stating leg feels weak "will buckle sometimes" however declines opportunities to sit  Toileting- Clothing Manipulation and Hygiene Minimal assistance;Sit  to/from stand;Sitting/lateral lean  Functional mobility during ADLs Min guard;+2 for safety/equipment  General ADL Comments Patient requiring increased assistance with self care tasks due to decreased activity tolerance, abdominal pain, safety awareness  Bed Mobility  General bed mobility comments in recliner  Transfers  Overall transfer level Needs assistance  Equipment used Rollator (4 wheels)  Transfers Sit to/from Stand  Sit to Stand Min guard  General transfer comment rocks to stand  Balance  Overall balance assessment Needs assistance  Sitting-balance support Feet supported;No upper extremity supported  Sitting balance-Leahy Scale Fair  Standing balance support Bilateral upper extremity supported;During functional activity;Reliant on assistive device for balance  Standing balance-Leahy Scale Poor  OT - End of Session  Equipment Utilized During Treatment Gait belt;Rollator (4 wheels)  Activity Tolerance Patient tolerated treatment well  Patient left in chair;with call bell/phone within reach  Nurse Communication Mobility status  OT Assessment  OT Recommendation/Assessment Patient needs continued OT Services  OT Visit Diagnosis Unsteadiness on feet (R26.81);Pain  Pain - part of body  (abdomen)  OT Problem List Pain;Obesity;Decreased safety awareness;Decreased activity tolerance;Impaired balance (sitting and/or standing)  OT Plan  OT Frequency (ACUTE ONLY) Min 2X/week  OT Treatment/Interventions (ACUTE ONLY) Self-care/ADL training;Balance training;Patient/family education;DME and/or AE instruction;Therapeutic activities  AM-PAC OT "6 Clicks" Daily Activity Outcome Measure (Version 2)  Help from another person eating meals? 3  Help from another person taking care of personal grooming? 3  Help from another person toileting, which includes using toliet, bedpan, or urinal? 3  Help from another person bathing (including washing, rinsing, drying)? 2  Help from another person to put  on and taking off regular upper body clothing? 3  Help from another person to put on and taking off regular lower body clothing? 2  6 Click Score 16  Progressive Mobility  What is the highest level of mobility based on the progressive mobility assessment? Level 5 (Walks with assist in room/hall) - Balance while stepping forward/back and can walk in room with assist - Complete  Activity Ambulated with assistance in hallway  OT Recommendation  Follow Up Recommendations Skilled nursing-short term rehab (<3 hours/day)  Assistance recommended at discharge Intermittent Supervision/Assistance  Patient can return home with the following A little help with walking and/or transfers;A lot of help with bathing/dressing/bathroom  Functional Status Assessent Patient has had a recent decline in their functional status and demonstrates the ability to make significant improvements in function in a reasonable and predictable amount of time.  OT Equipment None recommended by OT  Individuals Consulted  Consulted and Agree with Results and Recommendations Patient  Acute Rehab OT Goals  Patient Stated Goal Go to rehab  OT Goal Formulation With patient  Time For Goal Achievement 07/26/21  Potential to Achieve Goals Good  OT Time Calculation  OT Start Time (ACUTE ONLY) 0754  OT Stop Time (ACUTE ONLY) 0630  OT Time Calculation (min) 20 min  OT General Charges  $OT Visit 1 Visit  OT Evaluation  $OT Eval Low Complexity 1 Low  Written Expression  Dominant Hand Right   Delbert Phenix OT OT pager: [phone]

## 2021-07-12 NOTE — Discharge Instructions (Signed)
CCS _______[facility]  VENTRAL HERNIA REPAIR: POST OP INSTRUCTIONS  Always review your discharge instruction sheet given to you by the facility where your surgery was performed. IF YOU HAVE DISABILITY OR FAMILY LEAVE FORMS, YOU MUST BRING THEM TO THE OFFICE FOR PROCESSING.   DO NOT GIVE THEM TO YOUR DOCTOR.  1. A  prescription for pain medication may be given to you upon discharge.  Take your pain medication as prescribed, if needed.  If narcotic pain medicine is not needed, then you may take acetaminophen (Tylenol) or ibuprofen (Advil) as needed. 2. Take your usually prescribed medications unless otherwise directed. If you need a refill on your pain medication, please contact your pharmacy.  They will contact our office to request authorization. Prescriptions will not be filled after 5 pm or on week-ends. 3. You should follow a light diet the first 24 hours after arrival home, such as soup and crackers, etc.  Be sure to include lots of fluids daily.  Resume your normal diet the day after surgery. 4.Most patients will experience some swelling and bruising around the umbilicus or in the groin and scrotum.  Ice packs and reclining will help.  Swelling and bruising can take several days to resolve.  6. It is common to experience some constipation if taking pain medication after surgery.  Increasing fluid intake and taking a stool softener (such as Colace) will usually help or prevent this problem from occurring.  A mild laxative (Milk of Magnesia or Miralax) should be taken according to package directions if there are no bowel movements after 48 hours. 7. Unless discharge instructions indicate otherwise, you may remove your bandages 24-48 hours after surgery, and you may shower at that time.  You may have steri-strips (small skin tapes) in place directly over the incision.  These strips should be left on the skin for 7-10 days.  If your surgeon used skin glue on the incision, you may  shower in 24 hours.  The glue will flake off over the next 2-3 weeks.  Any sutures or staples will be removed at the office during your follow-up visit. 8. ACTIVITIES:  You may resume regular (light) daily activities beginning the next day--such as daily self-care, walking, climbing stairs--gradually increasing activities as tolerated.  You may have sexual intercourse when it is comfortable.  Refrain from any heavy lifting or straining until approved by your doctor.  a.You may drive when you are no longer taking prescription pain medication, you can comfortably wear a seatbelt, and you can safely maneuver your car and apply brakes.   9.You should see your doctor in the office for a follow-up appointment approximately 2-3 weeks after your surgery.  Make sure that you call for this appointment within a day or two after you arrive home to insure a convenient appointment time.   WHEN TO CALL YOUR DOCTOR: Fever over 101.0 Inability to urinate Nausea and/or vomiting Extreme swelling or bruising Continued bleeding from incision. Increased pain, redness, or drainage from the incision  The clinic staff is available to answer your questions during regular business hours.  Please dont hesitate to call and ask to speak to one of the nurses for clinical concerns.  If you have a medical emergency, go to the nearest emergency room or call 911.  A surgeon from [facility] is always on call at the hospital   [address] [place], [place], [place]  [address] ?  [address] [address], [place], [place]  [address] [phone] ? [phone] ? FAX [phone] Web site: [email or web address]  

## 2021-07-12 NOTE — TOC Initial Note (Signed)
Transition of Care [facility]) - Initial/Assessment Note   Patient Details  Name: Ana Jones MRN: [id number] Date of Birth: [birth year]  [facility] CM/SW Contact:    Sherie Don, LCSW Phone Number: 07/12/2021, 12:31 PM  Clinical Narrative: PT and OT evaluations recommended SNF. CSW spoke with patient and she is agreeable to short-term rehab. Patient's first and second choices are [facility] and [facility] as she has been to both before.  FL2 done; PASRR verified. Initial referral faxed out. TOC awaiting bed offers.  Expected Discharge Plan: [place] Barriers to Discharge: Continued Medical Work up, SNF Pending bed offer, Insurance Authorization  Patient Goals and CMS Choice Patient states their goals for this hospitalization and ongoing recovery are:: Go to [facility] or [facility] for rehab [employer].gov Compare Post Acute Care list provided to:: Patient Choice offered to / list presented to : Patient  Expected Discharge Plan and Services Expected Discharge Plan: [place] In-house Referral: Clinical Social Work Post Acute Care Choice: [place] Living arrangements for the past 2 months: [place]            DME Arranged: N/A DME Agency: NA  Prior Living Arrangements/Services Living arrangements for the past 2 months: Single Family Home Patient language and need for interpreter reviewed:: Yes Do you feel safe going back to the place where you live?: Yes      Need for Family Participation in Patient Care: No (Comment) Care giver support system in place?: Yes (comment) Criminal Activity/Legal Involvement Pertinent to Current Situation/Hospitalization: No - Comment as needed  Activities of Daily Living Home Assistive Devices/Equipment: Cane (specify quad or straight), Walker (specify type) ADL Screening (condition at time of admission) Patient's cognitive ability adequate to safely complete daily  activities?: Yes Is the patient deaf or have difficulty hearing?: No Does the patient have difficulty seeing, even when wearing glasses/contacts?: No Does the patient have difficulty concentrating, remembering, or making decisions?: No Patient able to express need for assistance with ADLs?: Yes Does the patient have difficulty dressing or bathing?: No Independently performs ADLs?: Yes (appropriate for developmental age) Does the patient have difficulty walking or climbing stairs?: Yes Weakness of Legs: Both Weakness of Arms/Hands: None  Permission Sought/Granted Permission sought to share information with : Facility Art therapist granted to share information with : Yes, Verbal Permission Granted Permission granted to share info w AGENCY: SNFs  Emotional Assessment Attitude/Demeanor/Rapport: Engaged Affect (typically observed): Accepting Orientation: : Oriented to Self, Oriented to Place, Oriented to  Time, Oriented to Situation Alcohol / Substance Use: Not Applicable Psych Involvement: No (comment)  Admission diagnosis:  Incarcerated umbilical hernia [Q73.4] Incarcerated hernia [K46.0] Patient Active Problem List   Diagnosis Date Noted   Incarcerated umbilical hernia 19/37/9024   Night muscle spasms 12/30/2015   Overactive bladder 12/30/2015   Chronic sciatica of right side 12/30/2015   Dyspnea 12/14/2015   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

## 2021-07-12 NOTE — Progress Notes (Signed)
ANTICOAGULATION CONSULT NOTE - Initial Consult  Pharmacy Consult for heparin while warfarin on hold Indication: history of DVT/PE  Allergies  Allergen Reactions   Iodides Anaphylaxis, Hives, Swelling and Other (See Comments)    WITH CONTRAST   Iodinated Contrast Media Anaphylaxis, Hives, Swelling and Other (See Comments)    Per pt, happened in 2012   Sulfa Antibiotics Itching, Swelling, Rash and Other (See Comments)    Rash (possibly from leukemia?)   Sulfamethoxazole Itching, Swelling and Rash    Patient Measurements: Height: 5\' 5"  (165.1 cm) Weight: (!) 158 kg (348 lb 5.2 oz) IBW/kg (Calculated) : 57 Heparin Dosing Weight: 97.3 kg  Vital Signs: Temp: 97.8 F (36.6 C) (02/02 0639) Temp Source: Oral (02/02 0639) BP: 159/98 (02/02 0639) Pulse Rate: 94 (02/02 0639)  Labs: Recent Labs    07/10/21 0127 07/10/21 0823 07/10/21 1954 07/11/21 0803 07/11/21 0929 07/12/21 0431  HGB 17.3*  --   --  15.4*  --  14.4  HCT 53.5*  --   --  48.2*  --  45.2  PLT 171  --   --  166  --  155  LABPROT  --    < > 18.8*  --  19.6* 19.8*  INR  --    < > 1.6*  --  1.7* 1.7*  CREATININE 0.78  --   --  0.81  --  0.84   < > = values in this interval not displayed.    Estimated Creatinine Clearance: 97.2 mL/min (by C-G formula based on SCr of 0.84 mg/dL).   Medical History: Past Medical History:  Diagnosis Date   Arthritis    Osteoartitis   Complication of anesthesia    Headache    History of blood transfusion    leukima   History of DVT (deep vein thrombosis) 12/2015   Leukemia ([place]) 2014   2017- in remission   Nerve pain    from polio - on Topamax- in legs   Overactive bladder    Polio    PONV (postoperative nausea and vomiting) 1984   pt denies   Pulmonary embolism ([place]) 12/2015   Shortness of breath dyspnea 12/2015   with PE- other times shortness of breath with exertion   Sleep apnea    Urinary incontinence    Wears dentures    Wears glasses     Medications:   Scheduled:   sodium chloride   Intravenous Once   acetaminophen  1,000 mg Oral Q8H   Chlorhexidine Gluconate Cloth  6 each Topical Daily   pantoprazole (PROTONIX) IV  40 mg Intravenous QHS   topiramate  50 mg Oral QHS   traMADol  100 mg Oral QHS    Assessment: 71 year-old-female who underwent ventral hernia repair on 07/10/21.  Patient is on warfarin PTA for history of DVT/PE.  Patient received FFP on 1/31 to reverse warfarin prior to surgery. Last dose of warfarin 1/27. Patient takes warfarin 4mg  daily at home.  Discussed with CCS - ok to start heparin drip with no bolus while warfarin is still held since hemoglobin is stable.  CCS waiting to resume warfarin until patient able to advance diet and tolerate.   Today, 07/12/21 - Start heparin drip at 1550 units/hr with no bolus per CCS - INR 1.7, hgb 14.4 - Add-on aPTT   Goal of Therapy:  INR 2-3 Heparin level 0.3-0.7 units/ml Monitor platelets by anticoagulation protocol: Yes   Plan:  Start heparin drip at 1550 units/hr Check heparin level  6 hours after drip started Monitor daily CBC, PT/INR Follow-up ability to resume warfarin D/c heparin drip once INR > 2  Dimple Nanas, PharmD 07/12/2021 7:40 AM

## 2021-07-12 NOTE — Progress Notes (Signed)
[place] for heparin while warfarin on hold Indication: history of DVT/PE  Allergies  Allergen Reactions   Iodides Anaphylaxis, Hives, Swelling and Other (See Comments)    WITH CONTRAST   Iodinated Contrast Media Anaphylaxis, Hives, Swelling and Other (See Comments)    Per pt, happened in 2012   Sulfa Antibiotics Itching, Swelling, Rash and Other (See Comments)    Rash (possibly from leukemia?)   Sulfamethoxazole Itching, Swelling and Rash    Patient Measurements: Height: 5\' 5"  (165.1 cm) Weight: (!) 158 kg (348 lb 5.2 oz) IBW/kg (Calculated) : 57 Heparin Dosing Weight: 97.3 kg  Vital Signs: Temp: 97.8 F (36.6 C) (02/02 0639) Temp Source: Oral (02/02 0639) BP: 159/98 (02/02 0639) Pulse Rate: 94 (02/02 0639)  Labs: Recent Labs    07/10/21 0127 07/10/21 9390 07/10/21 1954 07/11/21 0803 07/11/21 0929 07/12/21 0431 07/12/21 1516  HGB 17.3*  --   --  15.4*  --  14.4  --   HCT 53.5*  --   --  48.2*  --  45.2  --   PLT 171  --   --  166  --  155  --   APTT  --   --   --   --   --  32  --   LABPROT  --    < > 18.8*  --  19.6* 19.8*  --   INR  --    < > 1.6*  --  1.7* 1.7*  --   HEPARINUNFRC  --   --   --   --   --   --  <0.10*  CREATININE 0.78  --   --  0.81  --  0.84  --    < > = values in this interval not displayed.     Estimated Creatinine Clearance: 97.2 mL/min (by C-G formula based on SCr of 0.84 mg/dL).   Medical History: Past Medical History:  Diagnosis Date   Arthritis    Osteoartitis   Complication of anesthesia    Headache    History of blood transfusion    leukima   History of DVT (deep vein thrombosis) 12/2015   Leukemia ([place]) 2014   2017- in remission   Nerve pain    from polio - on Topamax- in legs   Overactive bladder    Polio    PONV (postoperative nausea and vomiting) 1984   pt denies   Pulmonary embolism ([place]) 12/2015   Shortness of breath dyspnea 12/2015   with PE- other times shortness of  breath with exertion   Sleep apnea    Urinary incontinence    Wears dentures    Wears glasses     Assessment: 70 year-old-female who underwent ventral hernia repair on 07/10/21.  Patient is on warfarin PTA for history of DVT/PE.  Patient received FFP on 1/31 to reverse warfarin prior to surgery. Last dose of warfarin 1/27. Patient takes warfarin 4mg  daily at home.  Discussed with CCS - ok to start heparin drip with no bolus while warfarin is still held since hemoglobin is stable. CCS waiting to resume warfarin until patient able to advance diet and tolerate.   Today, 07/12/21 - Heparin infusion started at 1550 units/hr this AM with no bolus per CCS - INR 1.7, aPTT 32 seconds - CBC: H/H, Pltc WNL  - initial heparin level < 0.1 units/mL, undetectable - per RN, heparin running at specified rate. No IV site or infusion issues reported. -  no bleeding issues reported per RN   Goal of Therapy:  INR 2-3 Heparin level 0.3-0.7 units/ml Monitor platelets by anticoagulation protocol: Yes   Plan:  Increase heparin infusion to 1900 units/hr Check heparin level 6 hours after rate change Monitor daily CBC, heparin level, PT/INR Follow-up ability to resume warfarin D/c heparin infusion once INR > 2   Lindell Spar, PharmD, BCPS Clinical Pharmacist  07/12/2021 3:54 PM

## 2021-07-12 NOTE — Evaluation (Signed)
Physical Therapy Evaluation Patient Details Name: Ana Jones MRN: [id number] DOB: [birth year] Today's Date: 07/12/2021  History of Present Illness  he patient is a 71 year old black female who presents with an umbilical hernia , S/PHERNIA REPAIR VENTRAL on 07/10/2021. [id number], DVT, PE, polio, sleep apnea, OA.  Clinical Impression  The patient received sitting in recliner. Patient   consented to ambulate . Patient reports feeling dizzy after recent IV pain medication. Patient ambulated x 90' using rollator and 1 person following with recliner for safety due to dizziness and patient reports LLE weakness from H/O polio and  that the left knee may buckle.  Patient  required Recliner to be pulled up  due to feeling weak  and dizzy.  Patient lives alone with no additional support from family and friends.   Patient will benefit from short term rehab at [facility]. Patient is willing. Pt admitted with above diagnosis.  Pt currently with functional limitations due to the deficits listed below (see PT Problem List). Pt will benefit from skilled PT to increase their independence and safety with mobility to allow discharge to the venue listed below.        Recommendations for follow up therapy are one component of a multi-disciplinary discharge planning process, led by the attending physician.  Recommendations may be updated based on patient status, additional functional criteria and insurance authorization.  Follow Up Recommendations Skilled nursing-short term rehab (<3 hours/day)    Assistance Recommended at Discharge Intermittent Supervision/Assistance  Patient can return home with the following  A little help with walking and/or transfers;A lot of help with bathing/dressing/bathroom;Assistance with cooking/housework;Assist for transportation;Help with stairs or ramp for entrance    Equipment Recommendations None recommended by PT  Recommendations for Other Services       Functional Status  Assessment Patient has had a recent decline in their functional status and demonstrates the ability to make significant improvements in function in a reasonable and predictable amount of time.     Precautions / Restrictions Precautions Precautions: Fall Precaution Comments: reports LLE weak from post polio Restrictions Weight Bearing Restrictions: No      Mobility  Bed Mobility               General bed mobility comments: in recliner    Transfers Overall transfer level: Needs assistance Equipment used: Rollator (4 wheels) Transfers: Sit to/from Stand Sit to Stand: Min guard           General transfer comment: rocks to stand    Ambulation/Gait Ambulation/Gait assistance: Min assist, +2 safety/equipment Gait Distance (Feet): 90 Feet Assistive device: Rollator (4 wheels) Gait Pattern/deviations: Step-to pattern, Step-through pattern, Wide base of support       General Gait Details: Patient reports feeling dizzy. Patient reports thje left leg buckles. Close follow  with recliner for safety  Stairs            Wheelchair Mobility    Modified Rankin (Stroke Patients Only)       Balance Overall balance assessment: Needs assistance Sitting-balance support: Feet supported, No upper extremity supported Sitting balance-Leahy Scale: Fair     Standing balance support: Bilateral upper extremity supported, During functional activity, Reliant on assistive device for balance Standing balance-Leahy Scale: Fair                               Pertinent Vitals/Pain Pain Assessment Pain Assessment: Faces Faces Pain Scale: Hurts little more Pain Location: abdomen,  Pain Descriptors / Indicators: Discomfort Pain Intervention(s): Monitored during session, Premedicated before session    Home Living Family/patient expects to be discharged to:: Private residence Living Arrangements: Alone   Type of Home: House Home Access: Stairs to enter   [employer] of Steps: small "half steps" onto porch then into house   Home Layout: One level Home Equipment: Tub bench;Rollator (4 wheels);Grab bars - tub/shower;Cane - single point Additional Comments: Patient goes between homes in [place] [employer]    Prior Function Prior Level of Function : Independent/Modified Independent             Mobility Comments: uses cane to get to/from her car but uses rollator in the house and keeps another rollator in her car ADLs Comments: Patient performs BADLs herself, has housekeeper for IADLs such as cleaning     Hand Dominance   Dominant Hand: Right    Extremity/Trunk Assessment        Lower Extremity Assessment Lower Extremity Assessment: Generalized weakness;LLE deficits/detail RLE Deficits / Details: noted  knee hyperextends during stance, post polio symptoms RLE Sensation: decreased light touch    Cervical / Trunk Assessment Cervical / Trunk Assessment: Other exceptions Cervical / Trunk Exceptions: body habitus  Communication   Communication: No difficulties  Cognition Arousal/Alertness: Awake/alert Behavior During Therapy: WFL for tasks assessed/performed Overall Cognitive Status: Within Functional Limits for tasks assessed                                          General Comments      Exercises     Assessment/Plan    PT Assessment Patient needs continued PT services  PT Problem List Decreased strength;Decreased mobility;Decreased knowledge of use of DME;Decreased activity tolerance;Decreased safety awareness       PT Treatment Interventions DME instruction;Therapeutic activities;Gait training;Therapeutic exercise;Patient/family education;Functional mobility training    PT Goals (Current goals can be found in the Care Plan section)  Acute Rehab PT Goals Patient Stated Goal: I would need rehab to get  back to independence PT Goal Formulation: With patient Time For Goal  Achievement: 07/26/21 Potential to Achieve Goals: Good    Frequency Min 2X/week     Co-evaluation               AM-PAC PT "6 Clicks" Mobility  Outcome Measure Help needed turning from your back to your side while in a flat bed without using bedrails?: A Lot Help needed moving from lying on your back to sitting on the side of a flat bed without using bedrails?: A Lot Help needed moving to and from a bed to a chair (including a wheelchair)?: A Little Help needed standing up from a chair using your arms (e.g., wheelchair or bedside chair)?: A Little Help needed to walk in hospital room?: A Lot Help needed climbing 3-5 steps with a railing? : Total 6 Click Score: 13    End of Session Equipment Utilized During Treatment: Gait belt Activity Tolerance: Treatment limited secondary to medical complications (Comment);Patient tolerated treatment well (felt weak and dizzy.) Patient left: in chair;with call bell/phone within reach Nurse Communication: Mobility status PT Visit Diagnosis: Unsteadiness on feet (R26.81);Pain    Time: 3875-6433 PT Time Calculation (min) (ACUTE ONLY): 21 min   Charges:   PT Evaluation $PT Eval Low Complexity: 1 Low          Santiago Glad  [facility] [place] Pager [phone] Office [phone]   Claretha Cooper 07/12/2021, 9:23 AM

## 2021-07-12 NOTE — Progress Notes (Signed)
Progress Note  2 Days Post-Op  Subjective: Pt is up in the chair this AM and reports she was able to walk in the halls. She is passing flatus and tolerating CLD. She does not have much help at home and would like to go somewhere for rehab upon discharge from here for a bit until she feels like she can manage on her own. She reports oxycodone makes her itch and makes her feel sedated, she has taken morphine sulfate in the past for knee pain and this works better for her.   Objective: Vital signs in last 24 hours: Temp:  [97.7 F (36.5 C)-98.6 F (37 C)] 97.8 F (36.6 C) (02/02 0639) Pulse Rate:  [65-97] 94 (02/02 0639) Resp:  [16-18] 18 (02/02 0639) BP: (127-159)/(67-98) 159/98 (02/02 0639) SpO2:  [94 %-96 %] 94 % (02/02 0639) Last BM Date: 07/07/21  Intake/Output from previous day: 02/01 0701 - 02/02 0700 In: 480 [P.O.:480] Out: 635 [Urine:635] Intake/Output this shift: Total I/O In: -  Out: 300 [Urine:300]  PE: General: WD, morbidly obese female who is up in chair in NAD Heart: regular, rate, and rhythm.   Lungs: CTAB, no wheezes, rhonchi, or rales noted.  Respiratory effort nonlabored Abd: soft, appropriately ttp, incision C/D/I with some mild bruising around, unable to auscultate BS secondary to body habitus.  Psych: A&Ox3 with an appropriate affect.    Lab Results:  Recent Labs    07/11/21 0803 07/12/21 0431  WBC 12.7* 10.4  HGB 15.4* 14.4  HCT 48.2* 45.2  PLT 166 155   BMET Recent Labs    07/11/21 0803 07/12/21 0431  NA 138 137  K 4.3 3.7  CL 107 107  CO2 22 24  GLUCOSE 133* 107*  BUN 17 19  CREATININE 0.81 0.84  CALCIUM 8.5* 8.4*   PT/INR Recent Labs    07/11/21 0929 07/12/21 0431  LABPROT 19.6* 19.8*  INR 1.7* 1.7*   CMP     Component Value Date/Time   NA 137 07/12/2021 0431   NA 141 12/15/2015 0000   K 3.7 07/12/2021 0431   CL 107 07/12/2021 0431   CO2 24 07/12/2021 0431   GLUCOSE 107 (H) 07/12/2021 0431   BUN 19 07/12/2021 0431    BUN 11 12/15/2015 0000   CREATININE 0.84 07/12/2021 0431   CALCIUM 8.4 (L) 07/12/2021 0431   PROT 7.4 07/09/2021 1925   ALBUMIN 4.0 07/09/2021 1925   AST 16 07/09/2021 1925   ALT 18 07/09/2021 1925   ALKPHOS 96 07/09/2021 1925   BILITOT 1.3 (H) 07/09/2021 1925   GFRNONAA >60 07/12/2021 0431   GFRAA >60 11/18/2017 1438   Lipase     Component Value Date/Time   LIPASE 27 07/09/2021 1925       Studies/Results: No results found.  Anti-infectives: Anti-infectives (From admission, onward)    Start     Dose/Rate Route Frequency Ordered Stop   07/10/21 1600  ceFAZolin (ANCEF) IVPB 3g/100 mL premix        3 g 200 mL/hr over 30 Minutes Intravenous  Once 07/10/21 1556 07/10/21 1615        Assessment/Plan POD2 s/p ventral hernia repair 07/10/21 Dr. Thermon Leyland - passing flatus - advance to soft diet this AM - pt working on mobilization today but would like to discuss possible SNF at discharge with TOC today as she has no one to help her at home - will discuss pain control with pharmacy today and possibly switch from oxy to PO morphine  -  continue binder    FEN: soft diet, SLIV VTE: SCDs, heparin gtt ID: Ancef pre-op   Hx of DVT/PE on warfarin - heparin gtt today and will transition to warfarin tomorrow if tolerating diet  Leukemia - in remission  Arthritis    LOS: 3 days   I reviewed nursing notes, last 24 h vitals and pain scores, last 48 h intake and output, and last 24 h labs and trends.  This care required low level of medical decision making.    Norm Parcel, [facility] 07/12/2021, 10:48 AM Please see Amion for pager number during day hours 7:00am-4:30pm

## 2021-07-12 NOTE — Progress Notes (Signed)
[place] for heparin while warfarin on hold Indication: history of DVT/PE  Allergies  Allergen Reactions   Iodides Anaphylaxis, Hives, Swelling and Other (See Comments)    WITH CONTRAST   Iodinated Contrast Media Anaphylaxis, Hives, Swelling and Other (See Comments)    Per pt, happened in 2012   Sulfa Antibiotics Itching, Swelling, Rash and Other (See Comments)    Rash (possibly from leukemia?)   Sulfamethoxazole Itching, Swelling and Rash    Patient Measurements: Height: 5\' 5"  (165.1 cm) Weight: (!) 158 kg (348 lb 5.2 oz) IBW/kg (Calculated) : 57 Heparin Dosing Weight: 97.3 kg  Vital Signs: Temp: 98.3 F (36.8 C) (02/02 2117) BP: 129/66 (02/02 2117) Pulse Rate: 87 (02/02 2117)  Labs: Recent Labs    07/10/21 0127 07/10/21 0823 07/10/21 1954 07/11/21 0803 07/11/21 0929 07/12/21 0431 07/12/21 1516 07/12/21 2226  HGB 17.3*  --   --  15.4*  --  14.4  --   --   HCT 53.5*  --   --  48.2*  --  45.2  --   --   PLT 171  --   --  166  --  155  --   --   APTT  --   --   --   --   --  32  --   --   LABPROT  --    < > 18.8*  --  19.6* 19.8*  --   --   INR  --    < > 1.6*  --  1.7* 1.7*  --   --   HEPARINUNFRC  --   --   --   --   --   --  <0.10* <0.10*  CREATININE 0.78  --   --  0.81  --  0.84  --   --    < > = values in this interval not displayed.     Estimated Creatinine Clearance: 97.2 mL/min (by C-G formula based on SCr of 0.84 mg/dL).   Medical History: Past Medical History:  Diagnosis Date   Arthritis    Osteoartitis   Complication of anesthesia    Headache    History of blood transfusion    leukima   History of DVT (deep vein thrombosis) 12/2015   Leukemia ([place]) 2014   2017- in remission   Nerve pain    from polio - on Topamax- in legs   Overactive bladder    Polio    PONV (postoperative nausea and vomiting) 1984   pt denies   Pulmonary embolism ([place]) 12/2015   Shortness of breath dyspnea 12/2015   with PE-  other times shortness of breath with exertion   Sleep apnea    Urinary incontinence    Wears dentures    Wears glasses     Assessment: 71 year-old-female who underwent ventral hernia repair on 07/10/21.  Patient is on warfarin PTA for history of DVT/PE.  Patient received FFP on 1/31 to reverse warfarin prior to surgery. Last dose of warfarin 1/27. Patient takes warfarin 4mg  daily at home.  Discussed with CCS - ok to start heparin drip with no bolus while warfarin is still held since hemoglobin is stable. CCS waiting to resume warfarin until patient able to advance diet and tolerate.   Today, 07/12/21 - Heparin infusion started at 1550 units/hr this AM with no bolus per CCS - INR 1.7, aPTT 32 seconds - CBC: H/H, Pltc WNL  07/12/2021: - heparin level remains <  0.1 units/mL, undetectable on IV heparin at 1900 units/hr - per RN, heparin running at specified rate. Concern for IV site infiltration so RN obtaining new IV site. - no bleeding issues reported per RN   Goal of Therapy:  INR 2-3 Heparin level 0.3-0.7 units/ml Monitor platelets by anticoagulation protocol: Yes   Plan:  Once new IV site obtained, increase heparin infusion to 2200 units/hr Check heparin level 6 hours after rate change Monitor daily CBC, heparin level, PT/INR Follow-up ability to resume warfarin D/c heparin infusion once INR > 2  Netta Cedars, PharmD, BCPS Clinical Pharmacist  07/12/2021 11:02 PM

## 2021-07-12 NOTE — NC FL2 (Signed)
[place] LEVEL OF CARE SCREENING TOOL     IDENTIFICATION  Patient Name: Ana Jones Birthdate: [birth year] Sex: female Admission Date (Current Location): 07/09/2021  [facility] and [place] Number:  Herbalist and Address:  [facility],  [address] [place], [place]      Provider Number: [id number]  Attending Physician Name and Address:  Edison Pace, Md, MD  Relative Name and Phone Number:  Makyna Niehoff (son) Ph: [phone]    Current Level of Care: Hospital Recommended Level of Care: [place] Prior Approval Number:    Date Approved/Denied:   PASRR Number: [id number]  Discharge Plan: SNF    Current Diagnoses: Patient Active Problem List   Diagnosis Date Noted   Incarcerated umbilical hernia 62/95/2841   Night muscle spasms 12/30/2015   Overactive bladder 12/30/2015   Chronic sciatica of right side 12/30/2015   Dyspnea 12/14/2015   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   Obesity 12/14/2015   History of myeloid leukemia 12/14/2015   History of DVT (deep vein thrombosis) 12/14/2015   PE (pulmonary thromboembolism) ([place]) 12/14/2015    Orientation RESPIRATION BLADDER Height & Weight     Self, Time, Situation, Place  Normal Continent Weight: (!) 348 lb 5.2 oz (158 kg) Height:  5\' 5"  (165.1 cm)  BEHAVIORAL SYMPTOMS/MOOD NEUROLOGICAL BOWEL NUTRITION STATUS   (N/A)  (N/A) Continent Diet (Soft diet)  AMBULATORY STATUS COMMUNICATION OF NEEDS Skin   Limited Assist Verbally Surgical wounds                       Personal Care Assistance Level of Assistance  Bathing, Feeding, Dressing Bathing Assistance: Maximum assistance (Patient requires max assist with lower body and min assist with upper body.) Feeding assistance: Independent Dressing Assistance: Maximum assistance (Patient requires max assist with lower body and min assist with upper body.)     Functional Limitations Info  Sight,  Hearing, Speech Sight Info: Impaired Hearing Info: Adequate Speech Info: Adequate    SPECIAL CARE FACTORS FREQUENCY  PT (By licensed PT), OT (By licensed OT)     PT Frequency: 5x's/week OT Frequency: 5x's/week            Contractures Contractures Info: Not present    Additional Factors Info  Code Status, Allergies Code Status Info: Full Allergies Info: Iodides, Iodinated Contrast Media, Sulfa Antibiotics, Sulfamethoxazole           Current Medications (07/12/2021):  This is the current hospital active medication list Current Facility-Administered Medications  Medication Dose Route Frequency Provider Last Rate Last Admin   0.9 %  sodium chloride infusion (Manually program via Guardrails IV Fluids)   Intravenous Once Jillyn Ledger, PA-C       acetaminophen (TYLENOL) tablet 1,000 mg  1,000 mg Oral Q8H Green, Terri L, RPH   1,000 mg at 07/11/21 2238   Chlorhexidine Gluconate Cloth 2 % PADS 6 each  6 each Topical Daily Stechschulte, Nickola Major, MD       cyclobenzaprine (FLEXERIL) tablet 10 mg  10 mg Oral TID PRN Norm Parcel, PA-C       heparin ADULT infusion 100 units/mL (25000 units/240mL)  1,550 Units/hr Intravenous Continuous Dimple Nanas, RPH 15.5 mL/hr at 07/12/21 0905 1,550 Units/hr at 07/12/21 0905   magnesium oxide (MAG-OX) tablet 200 mg  200 mg Oral Daily PRN Norm Parcel, PA-C       morphine 2 MG/ML injection 1-2 mg  1-2 mg  Intravenous Q1H PRN Autumn Messing III, MD   1 mg at 07/12/21 0657   ondansetron (ZOFRAN-ODT) disintegrating tablet 4 mg  4 mg Oral Q6H PRN Autumn Messing III, MD       Or   ondansetron [facility]) injection 4 mg  4 mg Intravenous Q6H PRN Autumn Messing III, MD   4 mg at 07/10/21 0738   pantoprazole (PROTONIX) injection 40 mg  40 mg Intravenous QHS Autumn Messing III, MD   40 mg at 07/11/21 2302   sodium chloride (OCEAN) 0.65 % nasal spray 1 spray  1 spray Each Nare PRN Stechschulte, Nickola Major, MD   1 spray at 07/11/21 0208   topiramate (TOPAMAX) tablet 50  mg  50 mg Oral QHS Norm Parcel, PA-C   50 mg at 07/11/21 2238   traMADol (ULTRAM) tablet 50-100 mg  50-100 mg Oral Q6H PRN Norm Parcel, PA-C         Discharge Medications: Please see discharge summary for a list of discharge medications.  Relevant Imaging Results:  Relevant Lab Results:   Additional Information SSN: [id number]  Sherie Don, LCSW

## 2021-07-13 LAB — PROTIME-INR
INR: 1.6 — ABNORMAL HIGH (ref 0.8–1.2)
Prothrombin Time: 18.7 seconds — ABNORMAL HIGH (ref 11.4–15.2)

## 2021-07-13 LAB — CBC
HCT: 43.5 % (ref 36.0–46.0)
Hemoglobin: 13.7 g/dL (ref 12.0–15.0)
MCH: 32 pg (ref 26.0–34.0)
MCHC: 31.5 g/dL (ref 30.0–36.0)
MCV: 101.6 fL — ABNORMAL HIGH (ref 80.0–100.0)
Platelets: 173 10*3/uL (ref 150–400)
RBC: 4.28 MIL/uL (ref 3.87–5.11)
RDW: 13.2 % (ref 11.5–15.5)
WBC: 8.8 10*3/uL (ref 4.0–10.5)
nRBC: 0 % (ref 0.0–0.2)

## 2021-07-13 LAB — HEPARIN LEVEL (UNFRACTIONATED)
Heparin Unfractionated: 0.98 IU/mL — ABNORMAL HIGH (ref 0.30–0.70)
Heparin Unfractionated: >1.1 IU/mL — ABNORMAL HIGH (ref 0.30–0.70)

## 2021-07-13 MED ORDER — HEPARIN (PORCINE) 25000 UT/250ML-% IV SOLN
1900.0000 [IU]/h | INTRAVENOUS | Status: DC
  Administered 2021-07-13 (×2): 1900 [IU]/h via INTRAVENOUS
  Filled 2021-07-13: qty 250

## 2021-07-13 MED ORDER — TRAMADOL HCL 50 MG PO TABS
100.0000 mg | ORAL_TABLET | Freq: Every day | ORAL | Status: DC
  Administered 2021-07-13 – 2021-07-18 (×6): 100 mg via ORAL
  Filled 2021-07-13 (×7): qty 2

## 2021-07-13 MED ORDER — ACETAMINOPHEN 500 MG PO TABS
1000.0000 mg | ORAL_TABLET | Freq: Every day | ORAL | Status: DC
  Administered 2021-07-14 – 2021-07-19 (×6): 1000 mg via ORAL
  Filled 2021-07-13 (×6): qty 2

## 2021-07-13 MED ORDER — WARFARIN - PHARMACIST DOSING INPATIENT
Freq: Every day | Status: DC
  Administered 2021-07-15: 1

## 2021-07-13 MED ORDER — HEPARIN (PORCINE) 25000 UT/250ML-% IV SOLN
1600.0000 [IU]/h | INTRAVENOUS | Status: DC
  Administered 2021-07-13: 1600 [IU]/h via INTRAVENOUS
  Filled 2021-07-13: qty 250

## 2021-07-13 MED ORDER — SIMETHICONE 80 MG PO CHEW: 80.0000 mg | CHEWABLE_TABLET | Freq: Four times a day (QID) | ORAL | Status: DC | PRN

## 2021-07-13 MED ORDER — HYDROCODONE-ACETAMINOPHEN 5-325 MG PO TABS
1.0000 | ORAL_TABLET | ORAL | Status: DC | PRN
  Administered 2021-07-14: 1 via ORAL
  Filled 2021-07-13: qty 1

## 2021-07-13 MED ORDER — WARFARIN SODIUM 6 MG PO TABS
6.0000 mg | ORAL_TABLET | Freq: Once | ORAL | Status: AC
  Administered 2021-07-13: 6 mg via ORAL
  Filled 2021-07-13: qty 1

## 2021-07-13 NOTE — TOC Progression Note (Signed)
Transition of Care [facility]) - Progression Note   Patient Details  Name: Ana Jones MRN: [id number] Date of Birth: 02-09-1951  Transition of Care [facility]) CM/SW [place], LCSW Phone Number: 07/13/2021, 12:52 PM  Clinical Narrative: Patient has not received any bed offers at this time due to her level of care. TOC to follow.  Expected Discharge Plan: [place] Barriers to Discharge: Continued Medical Work up, SNF Pending bed offer, Ship broker  Expected Discharge Plan and Services Expected Discharge Plan: [place] In-house Referral: Clinical Social Work Post Acute Care Choice: [place] Living arrangements for the past 2 months: Single Family Home           DME Arranged: N/A DME Agency: NA  Readmission Risk Interventions No flowsheet data found.

## 2021-07-13 NOTE — Progress Notes (Signed)
Physical Therapy Treatment Patient Details Name: Ana Jones MRN: [id number] DOB: [birth year] Today's Date: 07/13/2021   History of Present Illness Patient is a 71 year old  female who presents with an umbilical hernia , S/PHERNIA REPAIR VENTRAL on 07/10/2021. [id number], DVT, PE, polio, sleep apnea, OA.    PT Comments    Pt making good progress.  Requiring mod A transfers and min A to ambulate with multiple standing rest breaks.  Pt was premedicated for pain prior to session.  Continue to progress as able.     Recommendations for follow up therapy are one component of a multi-disciplinary discharge planning process, led by the attending physician.  Recommendations may be updated based on patient status, additional functional criteria and insurance authorization.  Follow Up Recommendations  Skilled nursing-short term rehab (<3 hours/day)     Assistance Recommended at Discharge Intermittent Supervision/Assistance  Patient can return home with the following A little help with walking and/or transfers;A lot of help with bathing/dressing/bathroom;Assistance with cooking/housework;Assist for transportation;Help with stairs or ramp for entrance   Equipment Recommendations  None recommended by PT    Recommendations for Other Services       Precautions / Restrictions Precautions Precautions: Fall Precaution Comments: reports LLE weak from post polio     Mobility  Bed Mobility Overal bed mobility: Needs Assistance Bed Mobility: Rolling, Sidelying to Sit, Sit to Sidelying Rolling: Supervision Sidelying to sit: Mod assist     Sit to sidelying: Mod assist General bed mobility comments: Mod A to lift trunk and mod A for legs back to bed    Transfers Overall transfer level: Needs assistance Equipment used: Rollator (4 wheels) Transfers: Sit to/from Stand Sit to Stand: Min assist           General transfer comment: light min A with increased time     Ambulation/Gait Ambulation/Gait assistance: Min assist Gait Distance (Feet): 90 Feet (90' then 30'x3) Assistive device: Rollator (4 wheels) Gait Pattern/deviations: Step-to pattern, Step-through pattern, Wide base of support Gait velocity: decreased     General Gait Details: Pt ambulated 90' but on return needed multiple standing rest breaks for 30'x3; Pt with DOE of 3/4 but VSS; utilized bariatric rollator but pt wants to try regular   Stairs             Wheelchair Mobility    Modified Rankin (Stroke Patients Only)       Balance Overall balance assessment: Needs assistance Sitting-balance support: Feet supported, No upper extremity supported Sitting balance-Leahy Scale: Good     Standing balance support: Bilateral upper extremity supported, Reliant on assistive device for balance Standing balance-Leahy Scale: Poor                              Cognition                                                Exercises      General Comments        Pertinent Vitals/Pain Pain Assessment Pain Assessment: (P) No/denies pain    Home Living                          Prior Function            PT Goals (current goals  can now be found in the care plan section) Progress towards PT goals: Progressing toward goals    Frequency    Min 2X/week      PT Plan Current plan remains appropriate    Co-evaluation              AM-PAC PT "6 Clicks" Mobility   Outcome Measure  Help needed turning from your back to your side while in a flat bed without using bedrails?: A Lot Help needed moving from lying on your back to sitting on the side of a flat bed without using bedrails?: A Lot Help needed moving to and from a bed to a chair (including a wheelchair)?: A Little Help needed standing up from a chair using your arms (e.g., wheelchair or bedside chair)?: A Little Help needed to walk in hospital room?: A Lot Help needed  climbing 3-5 steps with a railing? : Total 6 Click Score: 13    End of Session Equipment Utilized During Treatment: Gait belt Activity Tolerance: Patient tolerated treatment well Patient left: in bed;with call bell/phone within reach;with bed alarm set Nurse Communication: Mobility status PT Visit Diagnosis: Unsteadiness on feet (R26.81);Other abnormalities of gait and mobility (R26.89)     Time: 1555-1620 PT Time Calculation (min) (ACUTE ONLY): 25 min  Charges:  $Gait Training: 8-22 mins $Therapeutic Activity: 8-22 mins                     Abran Richard, PT Acute Rehab Services Pager [phone] [facility] [place] 07/13/2021, 5:00 PM

## 2021-07-13 NOTE — Progress Notes (Signed)
[place] for heparin bridge to warfarin Indication: history of DVT/PE  Allergies  Allergen Reactions   Iodides Anaphylaxis, Hives, Swelling and Other (See Comments)    WITH CONTRAST   Iodinated Contrast Media Anaphylaxis, Hives, Swelling and Other (See Comments)    Per pt, happened in 2012   Sulfa Antibiotics Itching, Swelling, Rash and Other (See Comments)    Rash (possibly from leukemia?)   Sulfamethoxazole Itching, Swelling and Rash    Patient Measurements: Height: 5\' 5"  (165.1 cm) Weight: (!) 158 kg (348 lb 5.2 oz) IBW/kg (Calculated) : 57 Heparin Dosing Weight: 97.3 kg  Vital Signs: Temp: 97.7 F (36.5 C) (02/03 1349) Temp Source: Oral (02/03 1349) BP: 136/94 (02/03 1349) Pulse Rate: 84 (02/03 1349)  Labs: Recent Labs    07/11/21 0803 07/11/21 0929 07/12/21 0431 07/12/21 1516 07/12/21 2226 07/13/21 0536 07/13/21 1410  HGB 15.4*  --  14.4  --   --  13.7  --   HCT 48.2*  --  45.2  --   --  43.5  --   PLT 166  --  155  --   --  173  --   APTT  --   --  32  --   --   --   --   LABPROT  --  19.6* 19.8*  --   --  18.7*  --   INR  --  1.7* 1.7*  --   --  1.6*  --   HEPARINUNFRC  --   --   --    < > <0.10* 0.98* >1.10*  CREATININE 0.81  --  0.84  --   --   --   --    < > = values in this interval not displayed.     Estimated Creatinine Clearance: 97.2 mL/min (by C-G formula based on SCr of 0.84 mg/dL).   Medical History: Past Medical History:  Diagnosis Date   Arthritis    Osteoartitis   Complication of anesthesia    Headache    History of blood transfusion    leukima   History of DVT (deep vein thrombosis) 12/2015   Leukemia ([place]) 2014   2017- in remission   Nerve pain    from polio - on Topamax- in legs   Overactive bladder    Polio    PONV (postoperative nausea and vomiting) 1984   pt denies   Pulmonary embolism ([place]) 12/2015   Shortness of breath dyspnea 12/2015   with PE- other times shortness of breath  with exertion   Sleep apnea    Urinary incontinence    Wears dentures    Wears glasses     Assessment: 71 year-old-female who underwent ventral hernia repair on 07/10/21.  Patient is on warfarin PTA for history of DVT/PE.  Patient received FFP on 1/31 to reverse warfarin prior to surgery. Last dose of warfarin 1/27. Patient takes warfarin 4mg  daily at home.  Pharmacy consulted to dose heparin drip on 2/2 and consulted to bridge to warfarin on 2/3.   Today, 07/13/21 - Heparin level > 1.1 despite holding drip x 1 hour and decreasing rate to 1900 units/hr; confirmed with RN and patient that lab was drawn from opposite arm that heparin is infusing - No bleeding reported per d/w RN  Goal of Therapy:  INR 2-3 Heparin level 0.3-0.7 units/ml INR goal 2-3 Monitor platelets by anticoagulation protocol: Yes   Plan: Hold heparin drip x 1 hour, then resume at  decreased rate 1600 units/hr Recheck heparin level 8hr after resuming drip Warfarin 6mg  PO tonight as previously ordered D/c heparin infusion once INR > 2 Monitor daily PT/INR, heparin level and CBC  Peggyann Juba, PharmD, BCPS Pharmacy: [phone] 07/13/2021 3:18 PM

## 2021-07-13 NOTE — Progress Notes (Signed)
ANTICOAGULATION CONSULT NOTE  Pharmacy Consult for heparin while warfarin on hold Indication: history of DVT/PE  Allergies  Allergen Reactions   Iodides Anaphylaxis, Hives, Swelling and Other (See Comments)    WITH CONTRAST   Iodinated Contrast Media Anaphylaxis, Hives, Swelling and Other (See Comments)    Per pt, happened in 2012   Sulfa Antibiotics Itching, Swelling, Rash and Other (See Comments)    Rash (possibly from leukemia?)   Sulfamethoxazole Itching, Swelling and Rash    Patient Measurements: Height: 5\' 5"  (165.1 cm) Weight: (!) 158 kg (348 lb 5.2 oz) IBW/kg (Calculated) : 57 Heparin Dosing Weight: 97.3 kg  Vital Signs: Temp: 98.1 F (36.7 C) (02/03 0513) BP: 112/71 (02/03 0513) Pulse Rate: 91 (02/03 0513)  Labs: Recent Labs    07/11/21 0803 07/11/21 0929 07/12/21 0431 07/12/21 1516 07/12/21 2226 07/13/21 0536  HGB 15.4*  --  14.4  --   --  13.7  HCT 48.2*  --  45.2  --   --  43.5  PLT 166  --  155  --   --  173  APTT  --   --  32  --   --   --   LABPROT  --  19.6* 19.8*  --   --  18.7*  INR  --  1.7* 1.7*  --   --  1.6*  HEPARINUNFRC  --   --   --  <0.10* <0.10* 0.98*  CREATININE 0.81  --  0.84  --   --   --      Estimated Creatinine Clearance: 97.2 mL/min (by C-G formula based on SCr of 0.84 mg/dL).   Medical History: Past Medical History:  Diagnosis Date   Arthritis    Osteoartitis   Complication of anesthesia    Headache    History of blood transfusion    leukima   History of DVT (deep vein thrombosis) 12/2015   Leukemia ([place]) 2014   2017- in remission   Nerve pain    from polio - on Topamax- in legs   Overactive bladder    Polio    PONV (postoperative nausea and vomiting) 1984   pt denies   Pulmonary embolism ([place]) 12/2015   Shortness of breath dyspnea 12/2015   with PE- other times shortness of breath with exertion   Sleep apnea    Urinary incontinence    Wears dentures    Wears glasses     Assessment: 71 year-old-female who  underwent ventral hernia repair on 07/10/21.  Patient is on warfarin PTA for history of DVT/PE.  Patient received FFP on 1/31 to reverse warfarin prior to surgery. Last dose of warfarin 1/27. Patient takes warfarin 4mg  daily at home.  Discussed with CCS - ok to start heparin drip with no bolus while warfarin is still held since hemoglobin is stable. CCS waiting to resume warfarin until patient able to advance diet and tolerate.   Today, 07/13/21 - Heparin level 0.98- now above goal on IV heparin 2200 units/hr - INR 1.6 - CBC: H/H, Pltc WNL - per RN, heparin running at specified rate with no interruptions after IV site change - no bleeding issues reported per RN   Goal of Therapy:  INR 2-3 Heparin level 0.3-0.7 units/ml Monitor platelets by anticoagulation protocol: Yes   Plan:  Hold heparin x 1 hr then decrease heparin rate to 1900 units/hr Check heparin level 6 hours after rate change Monitor daily CBC, heparin level, PT/INR Follow-up ability to resume warfarin D/c  heparin infusion once INR > 2  Netta Cedars, PharmD, BCPS Clinical Pharmacist  07/13/2021 6:49 AM

## 2021-07-13 NOTE — Progress Notes (Signed)
° °  Progress Note  3 Days Post-Op  Subjective: Tolerating liquids well, not eating much yet but tolerating what she has eating. Passing flatus, no BM.   Objective: Vital signs in last 24 hours: Temp:  [98.1 F (36.7 C)-98.3 F (36.8 C)] 98.1 F (36.7 C) (02/03 0513) Pulse Rate:  [85-91] 91 (02/03 0513) Resp:  [18] 18 (02/03 0513) BP: (112-154)/(66-90) 112/71 (02/03 0513) SpO2:  [95 %-98 %] 95 % (02/03 0513) Last BM Date: 07/07/21  Intake/Output from previous day: 02/02 0701 - 02/03 0700 In: 0  Out: 600 [Urine:600] Intake/Output this shift: Total I/O In: 525.8 [P.O.:240; I.V.:285.8] Out: 100 [Urine:100]  PE: General: WD, morbidly obese female who is up in chair in NAD Lungs: Respiratory effort nonlabored Abd: soft, appropriately ttp, binder present  Psych: A&Ox3 with an appropriate affect.    Lab Results:  Recent Labs    07/12/21 0431 07/13/21 0536  WBC 10.4 8.8  HGB 14.4 13.7  HCT 45.2 43.5  PLT 155 173   BMET Recent Labs    07/11/21 0803 07/12/21 0431  NA 138 137  K 4.3 3.7  CL 107 107  CO2 22 24  GLUCOSE 133* 107*  BUN 17 19  CREATININE 0.81 0.84  CALCIUM 8.5* 8.4*   PT/INR Recent Labs    07/12/21 0431 07/13/21 0536  LABPROT 19.8* 18.7*  INR 1.7* 1.6*   CMP     Component Value Date/Time   NA 137 07/12/2021 0431   NA 141 12/15/2015 0000   K 3.7 07/12/2021 0431   CL 107 07/12/2021 0431   CO2 24 07/12/2021 0431   GLUCOSE 107 (H) 07/12/2021 0431   BUN 19 07/12/2021 0431   BUN 11 12/15/2015 0000   CREATININE 0.84 07/12/2021 0431   CALCIUM 8.4 (L) 07/12/2021 0431   PROT 7.4 07/09/2021 1925   ALBUMIN 4.0 07/09/2021 1925   AST 16 07/09/2021 1925   ALT 18 07/09/2021 1925   ALKPHOS 96 07/09/2021 1925   BILITOT 1.3 (H) 07/09/2021 1925   GFRNONAA >60 07/12/2021 0431   GFRAA >60 11/18/2017 1438   Lipase     Component Value Date/Time   LIPASE 27 07/09/2021 1925       Studies/Results: No results  found.  Anti-infectives: Anti-infectives (From admission, onward)    Start     Dose/Rate Route Frequency Ordered Stop   07/10/21 1600  ceFAZolin (ANCEF) IVPB 3g/100 mL premix        3 g 200 mL/hr over 30 Minutes Intravenous  Once 07/10/21 1556 07/10/21 1615        Assessment/Plan POD3 s/p ventral hernia repair 07/10/21 Dr. Thermon Leyland - passing flatus, continue soft diet  - planning SNF on discharge, continue to mobilize as tolerated - continue to work on pain control  - continue binder    FEN: soft diet, SLIV VTE: SCDs, heparin gtt, start transition back to warfarin  ID: Ancef pre-op   Hx of DVT/PE on warfarin - heparin gtt -  transition to warfarin tomorrow if tolerating diet  Leukemia - in remission  Arthritis    LOS: 4 days   I reviewed last 24 h vitals and pain scores, last 48 h intake and output, last 24 h labs and trends, and pharmacy notes .  This care required low level of medical decision making.    Norm Parcel, [facility] 07/13/2021, 11:17 AM Please see Amion for pager number during day hours 7:00am-4:30pm

## 2021-07-13 NOTE — Progress Notes (Signed)
ANTICOAGULATION CONSULT NOTE  Pharmacy Consult for heparin bridge to warfarin Indication: history of DVT/PE  Allergies  Allergen Reactions   Iodides Anaphylaxis, Hives, Swelling and Other (See Comments)    WITH CONTRAST   Iodinated Contrast Media Anaphylaxis, Hives, Swelling and Other (See Comments)    Per pt, happened in 2012   Sulfa Antibiotics Itching, Swelling, Rash and Other (See Comments)    Rash (possibly from leukemia?)   Sulfamethoxazole Itching, Swelling and Rash    Patient Measurements: Height: 5\' 5"  (165.1 cm) Weight: (!) 158 kg (348 lb 5.2 oz) IBW/kg (Calculated) : 57 Heparin Dosing Weight: 97.3 kg  Vital Signs: Temp: 98.1 F (36.7 C) (02/03 0513) BP: 112/71 (02/03 0513) Pulse Rate: 91 (02/03 0513)  Labs: Recent Labs    07/11/21 0803 07/11/21 0929 07/12/21 0431 07/12/21 1516 07/12/21 2226 07/13/21 0536  HGB 15.4*  --  14.4  --   --  13.7  HCT 48.2*  --  45.2  --   --  43.5  PLT 166  --  155  --   --  173  APTT  --   --  32  --   --   --   LABPROT  --  19.6* 19.8*  --   --  18.7*  INR  --  1.7* 1.7*  --   --  1.6*  HEPARINUNFRC  --   --   --  <0.10* <0.10* 0.98*  CREATININE 0.81  --  0.84  --   --   --      Estimated Creatinine Clearance: 97.2 mL/min (by C-G formula based on SCr of 0.84 mg/dL).   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  Patient is on warfarin PTA for history of DVT/PE.  Patient received FFP on 1/31 to reverse warfarin prior to surgery. Last dose of warfarin 1/27. Patient takes warfarin 4mg  daily at home.  Pharmacy consulted to dose heparin drip on 2/2 and consulted to bridge to warfarin on 2/3.   Today, 07/13/21 - See full note by Lavonia Drafts) Irven Easterly, PharmD for heparin dose adjustments  - Patient has tolerated some PO intake since diet initiated 2/2 - INR 1.6 which is subtherapeutic  - CBC: H/H, Pltc WNL  Goal of Therapy:  INR 2-3 Heparin level 0.3-0.7 units/ml Monitor platelets by anticoagulation protocol: Yes   Heparin Plan:  Heparin infusion resumed at 1900 units/hr @ 0825 F/u 1400 HL Monitor daily CBC, heparin level  Warfarin Plan: Warfarin 6mg  PO tonight (1.5x home dose to augment INR) D/c heparin infusion once INR > 2 Monitor daily PT/INR  Dimple Nanas, PharmD 07/13/2021 11:22 AM

## 2021-07-14 LAB — HEPARIN LEVEL (UNFRACTIONATED)
Heparin Unfractionated: 0.96 IU/mL — ABNORMAL HIGH (ref 0.30–0.70)
Heparin Unfractionated: 0.52 IU/mL (ref 0.30–0.70)
Heparin Unfractionated: 0.47 IU/mL (ref 0.30–0.70)

## 2021-07-14 LAB — CBC
HCT: 42.9 % (ref 36.0–46.0)
Hemoglobin: 13.7 g/dL (ref 12.0–15.0)
MCH: 32.4 pg (ref 26.0–34.0)
MCHC: 31.9 g/dL (ref 30.0–36.0)
MCV: 101.4 fL — ABNORMAL HIGH (ref 80.0–100.0)
Platelets: 193 10*3/uL (ref 150–400)
RBC: 4.23 MIL/uL (ref 3.87–5.11)
RDW: 13 % (ref 11.5–15.5)
WBC: 8 10*3/uL (ref 4.0–10.5)
nRBC: 0 % (ref 0.0–0.2)

## 2021-07-14 LAB — PROTIME-INR
INR: 1.4 — ABNORMAL HIGH (ref 0.8–1.2)
Prothrombin Time: 17 seconds — ABNORMAL HIGH (ref 11.4–15.2)

## 2021-07-14 MED ORDER — OXYCODONE HCL 5 MG PO TABS: 5.0000 mg | ORAL_TABLET | Freq: Four times a day (QID) | ORAL | Status: DC | PRN

## 2021-07-14 MED ORDER — HEPARIN (PORCINE) 25000 UT/250ML-% IV SOLN
1600.0000 [IU]/h | INTRAVENOUS | Status: DC
  Administered 2021-07-14 – 2021-07-17 (×6): 1400 [IU]/h via INTRAVENOUS
  Administered 2021-07-18 – 2021-07-19 (×2): 1600 [IU]/h via INTRAVENOUS
  Filled 2021-07-14 (×8): qty 250

## 2021-07-14 MED ORDER — MORPHINE SULFATE 15 MG PO TABS
15.0000 mg | ORAL_TABLET | ORAL | Status: DC | PRN
  Administered 2021-07-14 – 2021-07-19 (×8): 15 mg via ORAL
  Filled 2021-07-14 (×10): qty 1

## 2021-07-14 MED ORDER — WARFARIN SODIUM 6 MG PO TABS
6.0000 mg | ORAL_TABLET | Freq: Once | ORAL | Status: AC
  Administered 2021-07-14: 6 mg via ORAL
  Filled 2021-07-14: qty 1

## 2021-07-14 MED ORDER — MORPHINE SULFATE (PF) 2 MG/ML IV SOLN
2.0000 mg | INTRAVENOUS | Status: DC | PRN
  Administered 2021-07-14 – 2021-07-19 (×11): 2 mg via INTRAVENOUS
  Filled 2021-07-14 (×12): qty 1

## 2021-07-14 NOTE — Progress Notes (Addendum)
[place] for heparin bridge to warfarin Indication: history of DVT/PE  Allergies  Allergen Reactions   Iodides Anaphylaxis, Hives, Swelling and Other (See Comments)    WITH CONTRAST   Iodinated Contrast Media Anaphylaxis, Hives, Swelling and Other (See Comments)    Per pt, happened in 2012   Sulfa Antibiotics Itching, Swelling, Rash and Other (See Comments)    Rash (possibly from leukemia?)   Sulfamethoxazole Itching, Swelling and Rash    Patient Measurements: Height: 5\' 5"  (165.1 cm) Weight: (!) 158 kg (348 lb 5.2 oz) IBW/kg (Calculated) : 57 Heparin Dosing Weight: 97.3 kg  Vital Signs: Temp: 98.6 F (37 C) (02/03 2058) Temp Source: Oral (02/03 2058) BP: 134/60 (02/03 2058) Pulse Rate: 89 (02/03 2058)  Labs: Recent Labs    07/11/21 0803 07/11/21 0929 07/12/21 0431 07/12/21 1516 07/13/21 0536 07/13/21 1410 07/14/21 0103  HGB 15.4*  --  14.4  --  13.7  --  13.7  HCT 48.2*  --  45.2  --  43.5  --  42.9  PLT 166  --  155  --  173  --  193  APTT  --   --  32  --   --   --   --   LABPROT  --    < > 19.8*  --  18.7*  --  17.0*  INR  --    < > 1.7*  --  1.6*  --  1.4*  HEPARINUNFRC  --   --   --    < > 0.98* >1.10* 0.96*  CREATININE 0.81  --  0.84  --   --   --   --    < > = values in this interval not displayed.     Estimated Creatinine Clearance: 97.2 mL/min (by C-G formula based on SCr of 0.84 mg/dL).   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  Patient is on warfarin PTA for history of DVT/PE.  Patient received FFP on 1/31 to reverse warfarin prior to surgery. Last dose of warfarin 1/27. Patient takes warfarin 4mg  daily at home.  Pharmacy consulted to dose heparin drip on 2/2 and consulted to bridge to warfarin on 2/3.   Today, 07/14/21 - Heparin level 0.96- remains elevated despite holding drip x 1 hour and decreasing rate to 1600 units/hr - Confirmed with RN that heparin is infusing at correct rate.  Lab was drawn in same arm but distal to heparin infusion.  - No bleeding reported per d/w RN - INR 1.4- warfarin resumed 2/3 - CBC: Hg/pltc WNL  Goal of Therapy:  INR 2-3 Heparin level 0.3-0.7 units/ml INR goal 2-3 Monitor platelets by  anticoagulation protocol: Yes   Plan: Hold heparin drip x 1 hour, then resume at decreased rate 1400 units/hr Recheck heparin level 8hr after resuming drip Warfarin 6mg  PO x1 tonight  D/c heparin infusion once INR > 2 Monitor daily PT/INR, heparin level and CBC  Netta Cedars, PharmD, BCPS Pharmacy: [phone] 07/14/2021 1:57 AM

## 2021-07-14 NOTE — Progress Notes (Signed)
4 Days Post-Op   Subjective/Chief Complaint: Patient is very irritable this morning She has some confusion regarding heparin vs. Coumadin and the effect on her INR. She would like a regular diet instead of soft diet Significant flatus, no BM yet With her previous surgeries, she was unable to use oxycodone or hydrocodone due to the itching, so she was managed with PO morphine.   She has concerns about urinating on herself if the Purewick is removed, since she is not able to get herself to the rest room independently   Objective: Vital signs in last 24 hours: Temp:  [97.7 F (36.5 C)-98.6 F (37 C)] 97.7 F (36.5 C) (02/04 0651) Pulse Rate:  [84-89] 84 (02/04 0651) Resp:  [18] 18 (02/04 0651) BP: (103-136)/(60-94) 103/70 (02/04 0651) SpO2:  [92 %-97 %] 92 % (02/04 0651) Weight:  [159 kg] 159 kg (02/03 2200) Last BM Date: 07/07/21  Intake/Output from previous day: 02/03 0701 - 02/04 0700 In: 1754 [P.O.:1200; I.V.:554] Out: 1250 [Urine:1250] Intake/Output this shift: No intake/output data recorded.  General: WD, morbidly obese female who is up in chair in NAD Lungs: Respiratory effort nonlabored Abd: soft, incision c/d/i Psych: A&Ox3 with an appropriate affect.   Lab Results:  Recent Labs    07/13/21 0536 07/14/21 0103  WBC 8.8 8.0  HGB 13.7 13.7  HCT 43.5 42.9  PLT 173 193   BMET Recent Labs    07/12/21 0431  NA 137  K 3.7  CL 107  CO2 24  GLUCOSE 107*  BUN 19  CREATININE 0.84  CALCIUM 8.4*   PT/INR Recent Labs    07/13/21 0536 07/14/21 0103  LABPROT 18.7* 17.0*  INR 1.6* 1.4*   ABG No results for input(s): PHART, HCO3 in the last 72 hours.  Invalid input(s): PCO2, PO2  Studies/Results: No results found.  Anti-infectives: Anti-infectives (From admission, onward)    Start     Dose/Rate Route Frequency Ordered Stop   07/10/21 1600  ceFAZolin (ANCEF) IVPB 3g/100 mL premix        3 g 200 mL/hr over 30 Minutes Intravenous  Once 07/10/21 1556  07/10/21 1615       Assessment/Plan: POD4 s/p ventral hernia repair 07/10/21 Dr. Thermon Leyland - passing flatus, advance to regular diet - planning SNF on discharge, continue to mobilize as tolerated - continue to work on pain control - transition to PO morphine with IV morphine only for breakthrough - continue binder    FEN: reg diet, SLIV VTE: SCDs, heparin gtt, back on Warfarin.  Stop heparin when INR >2.0 ID: Ancef pre-op   Hx of DVT/PE on warfarin - heparin gtt -  transition to warfarint  Leukemia - in remission  Arthritis    Purewick for at least one more day   LOS: 5 days    Ana Jones 07/14/2021

## 2021-07-14 NOTE — Progress Notes (Signed)
°   07/14/21 1400  Mobility  Activity Refused mobility   Pt refused mobility, stated she was too tired to work today.  [place] Specialist Acute Re[facility] Services Office: [phone]

## 2021-07-14 NOTE — Progress Notes (Addendum)
[place] for heparin bridge to warfarin Indication: history of DVT/PE  Allergies  Allergen Reactions   Iodides Anaphylaxis, Hives, Swelling and Other (See Comments)    WITH CONTRAST   Iodinated Contrast Media Anaphylaxis, Hives, Swelling and Other (See Comments)    Per pt, happened in 2012   Sulfa Antibiotics Itching, Swelling, Rash and Other (See Comments)    Rash (possibly from leukemia?)   Sulfamethoxazole Itching, Swelling and Rash    Patient Measurements: Height: 5\' 5"  (165.1 cm) Weight: (!) 159 kg (350 lb 8.5 oz) IBW/kg (Calculated) : 57 Heparin Dosing Weight: 97.3 kg  Vital Signs: Temp: 97.7 F (36.5 C) (02/04 0651) Temp Source: Oral (02/04 0651) BP: 103/70 (02/04 0651) Pulse Rate: 84 (02/04 0651)  Labs: Recent Labs    07/12/21 0431 07/12/21 1516 07/13/21 0536 07/13/21 1410 07/14/21 0103 07/14/21 1029  HGB 14.4  --  13.7  --  13.7  --   HCT 45.2  --  43.5  --  42.9  --   PLT 155  --  173  --  193  --   APTT 32  --   --   --   --   --   LABPROT 19.8*  --  18.7*  --  17.0*  --   INR 1.7*  --  1.6*  --  1.4*  --   HEPARINUNFRC  --    < > 0.98* >1.10* 0.96* 0.52  CREATININE 0.84  --   --   --   --   --    < > = values in this interval not displayed.     Estimated Creatinine Clearance: 97.6 mL/min (by C-G formula based on SCr of 0.84 mg/dL).   Assessment: 71 year-old-female who underwent ventral hernia repair on 07/10/21.  Patient is on warfarin PTA for history of DVT/PE.  Patient received FFP on 1/31 to reverse warfarin prior to surgery. Last dose of warfarin 1/27. Patient takes warfarin 4mg  daily at home.  Pharmacy consulted to dose heparin drip on 2/2 and consulted to bridge to warfarin on 2/3.   Today, 07/14/21 Heparin level now therapeutic after holding heparin x 1 hr and reducing rate to 1400 units/hr INR subtherapeutic and still trending down after reversal/holding VKA No bleeding or infusion issues per RN CBC:  Hg/pltc WNL No major drug-drug interactions with warfarin noted Charting </= 25% of meals eaten  Goal of Therapy:  INR 2-3 Heparin level 0.3-0.7 units/ml INR goal 2-3 Monitor platelets by anticoagulation protocol: Yes   Plan: Continue heparin at 1400 units/hr Recheck confirmatory heparin level in 10-12 hr - longer time to steady state in this 160 kg pt, high risk for adjusting too rapidly and overshooting, as demonstrated over the past 72 hr Warfarin 6mg  PO x1 tonight  D/c heparin infusion once INR > 2 Monitor daily PT/INR, heparin level and CBC  Reuel Boom, PharmD, BCPS [phone] 07/14/2021, 11:12 AM

## 2021-07-14 NOTE — Progress Notes (Signed)
[place] for heparin bridge to warfarin Indication: history of DVT/PE  Allergies  Allergen Reactions   Iodides Anaphylaxis, Hives, Swelling and Other (See Comments)    WITH CONTRAST   Iodinated Contrast Media Anaphylaxis, Hives, Swelling and Other (See Comments)    Per pt, happened in 2012   Sulfa Antibiotics Itching, Swelling, Rash and Other (See Comments)    Rash (possibly from leukemia?)   Sulfamethoxazole Itching, Swelling and Rash    Patient Measurements: Height: 5\' 5"  (165.1 cm) Weight: (!) 159 kg (350 lb 8.5 oz) IBW/kg (Calculated) : 57 Heparin Dosing Weight: 97.3 kg  Vital Signs: Temp: 97.3 F (36.3 C) (02/04 2037) Temp Source: Oral (02/04 2037) BP: 146/119 (02/04 2037) Pulse Rate: 59 (02/04 2037)  Labs: Recent Labs    07/12/21 0431 07/12/21 1516 07/13/21 0536 07/13/21 1410 07/14/21 0103 07/14/21 1029 07/14/21 2227  HGB 14.4  --  13.7  --  13.7  --   --   HCT 45.2  --  43.5  --  42.9  --   --   PLT 155  --  173  --  193  --   --   APTT 32  --   --   --   --   --   --   LABPROT 19.8*  --  18.7*  --  17.0*  --   --   INR 1.7*  --  1.6*  --  1.4*  --   --   HEPARINUNFRC  --    < > 0.98*   < > 0.96* 0.52 0.47  CREATININE 0.84  --   --   --   --   --   --    < > = values in this interval not displayed.     Estimated Creatinine Clearance: 97.6 mL/min (by C-G formula based on SCr of 0.84 mg/dL).   Assessment: 71 year-old-female who underwent ventral hernia repair on 07/10/21.  Patient is on warfarin PTA for history of DVT/PE.  Patient received FFP on 1/31 to reverse warfarin prior to surgery. Last dose of warfarin 1/27. Patient takes warfarin 4mg  daily at home.  Pharmacy consulted to dose heparin drip on 2/2 and consulted to bridge to warfarin on 2/3.   Today, 07/14/21 @ 2315 Confirmatory Heparin level 0.47- remains therapeutic on IV heparin 1400 units/hr No bleeding or infusion issues per RN  Goal of Therapy:  INR  2-3 Heparin level 0.3-0.7 units/ml INR goal 2-3 Monitor platelets by anticoagulation protocol: Yes   Plan: Continue heparin at 1400 units/hr D/c heparin infusion once INR > 2 Monitor daily PT/INR, heparin level and CBC  Netta Cedars, PharmD, BCPS [phone] 07/14/2021, 11:13 PM

## 2021-07-14 NOTE — Progress Notes (Addendum)
Pt refused mobility this am, stated that she had been awake all night and just wanted to sleep this am. Instructed on IS exercises and doing 10 per hour, pt again stated "I'm only gonna do 3 of these . Marland Kitchen I'm not gonna do 10 . . " When this nurse hung her new bag of IV heparin, pt started screaming at this nurse, demanding that I stop her heparin gtt, that she received a dose of Coumadin yesterday and therefore she shouldn't have any more heparin. Attempted to explain short-acting vs long-acting blood thinners to pt, pt cont to scream at me. Explained the dangers of not following the specific MD/pharmacy instructions w her anticoagulants (ie PEs and DVTs). Offered to have pharmacist come and speak w pt about dosing and pt refused.

## 2021-07-15 LAB — PROTIME-INR
INR: 1.4 — ABNORMAL HIGH (ref 0.8–1.2)
Prothrombin Time: 17 seconds — ABNORMAL HIGH (ref 11.4–15.2)

## 2021-07-15 LAB — CBC
HCT: 43.2 % (ref 36.0–46.0)
Hemoglobin: 13.7 g/dL (ref 12.0–15.0)
MCH: 32.6 pg (ref 26.0–34.0)
MCHC: 31.7 g/dL (ref 30.0–36.0)
MCV: 102.9 fL — ABNORMAL HIGH (ref 80.0–100.0)
Platelets: 199 10*3/uL (ref 150–400)
RBC: 4.2 MIL/uL (ref 3.87–5.11)
RDW: 13.1 % (ref 11.5–15.5)
WBC: 8.8 10*3/uL (ref 4.0–10.5)
nRBC: 0 % (ref 0.0–0.2)

## 2021-07-15 LAB — HEPARIN LEVEL (UNFRACTIONATED): Heparin Unfractionated: 0.46 IU/mL (ref 0.30–0.70)

## 2021-07-15 MED ORDER — DIPHENHYDRAMINE HCL 25 MG PO CAPS
25.0000 mg | ORAL_CAPSULE | Freq: Four times a day (QID) | ORAL | Status: DC | PRN
  Administered 2021-07-15 – 2021-07-19 (×7): 25 mg via ORAL
  Filled 2021-07-15 (×7): qty 1

## 2021-07-15 MED ORDER — WARFARIN SODIUM 6 MG PO TABS
6.0000 mg | ORAL_TABLET | Freq: Once | ORAL | Status: AC
  Administered 2021-07-15: 6 mg via ORAL
  Filled 2021-07-15: qty 1

## 2021-07-15 NOTE — Plan of Care (Signed)
°  Problem: Clinical Measurements: Goal: Diagnostic test results will improve Outcome: Progressing   Problem: Clinical Measurements: Goal: Respiratory complications will improve Outcome: Progressing   Problem: Clinical Measurements: Goal: Cardiovascular complication will be avoided Outcome: Progressing   Problem: Activity: Goal: Risk for activity intolerance will decrease Outcome: Progressing   Problem: Nutrition: Goal: Adequate nutrition will be maintained Outcome: Progressing

## 2021-07-15 NOTE — Progress Notes (Signed)
ANTICOAGULATION CONSULT NOTE  Pharmacy Consult for heparin bridge to warfarin Indication: history of DVT/PE  Allergies  Allergen Reactions   Iodides Anaphylaxis, Hives, Swelling and Other (See Comments)    WITH CONTRAST   Iodinated Contrast Media Anaphylaxis, Hives, Swelling and Other (See Comments)    Per pt, happened in 2012   Sulfa Antibiotics Itching, Swelling, Rash and Other (See Comments)    Rash (possibly from leukemia?)   Sulfamethoxazole Itching, Swelling and Rash    Patient Measurements: Height: 5\' 5"  (165.1 cm) Weight: (!) 159 kg (350 lb 8.5 oz) IBW/kg (Calculated) : 57 Heparin Dosing Weight: 97.3 kg  Vital Signs: Temp: 97.8 F (36.6 C) (02/05 0648) Temp Source: Oral (02/05 0648) BP: 128/70 (02/05 0648) Pulse Rate: 91 (02/05 0648)  Labs: Recent Labs    07/13/21 0536 07/13/21 1410 07/14/21 0103 07/14/21 1029 07/14/21 2227 07/15/21 0500  HGB 13.7  --  13.7  --   --  13.7  HCT 43.5  --  42.9  --   --  43.2  PLT 173  --  193  --   --  199  LABPROT 18.7*  --  17.0*  --   --  17.0*  INR 1.6*  --  1.4*  --   --  1.4*  HEPARINUNFRC 0.98*   < > 0.96* 0.52 0.47 0.46   < > = values in this interval not displayed.     Estimated Creatinine Clearance: 97.6 mL/min (by C-G formula based on SCr of 0.84 mg/dL).   Assessment: 71 year-old-female who underwent ventral hernia repair on 07/10/21.  Patient is on warfarin PTA for history of DVT/PE.  Patient received FFP on 1/31 to reverse warfarin prior to surgery. Last dose of warfarin 1/27. Patient takes warfarin 4mg  daily at home.  Pharmacy consulted to dose heparin drip on 2/2 and consulted to bridge to warfarin on 2/3.   Today, 07/15/21 Heparin level remains stable and therapeutic on 1400 units/hr INR subtherapeutic but has stopped trending down after resuming boosted warfarin No bleeding or infusion issues per RN CBC: Hg/pltc WNL No major drug-drug interactions with warfarin noted Charting 100% of meals  eaten  Goal of Therapy:  INR 2-3 Heparin level 0.3-0.7 units/ml INR goal 2-3 Monitor platelets by anticoagulation protocol: Yes   Plan: Continue heparin at 1400 units/hr Repeat warfarin 6mg  PO x1 tonight; would transition to 4 mg daily once INR begins to trend back up D/c heparin infusion once INR > 2 Monitor daily PT/INR, heparin level and CBC  Reuel Boom, PharmD, BCPS [phone] 07/15/2021, 11:07 AM

## 2021-07-15 NOTE — Progress Notes (Signed)
Pt up to chair and used bsc with two person and walker assist. Pt did report feeling lightly dizzy but overall was stable. No needs at this time. Rn will continue to monitor.

## 2021-07-15 NOTE — Progress Notes (Signed)
5 Days Post-Op   Subjective/Chief Complaint: Patient continues to be abrasive and difficult Sat in chair yesterday, but no ambulation Flatus, no BM Tolerating regular diet    Objective: Vital signs in last 24 hours: Temp:  [97.3 F (36.3 C)-98.1 F (36.7 C)] 97.8 F (36.6 C) (02/05 0648) Pulse Rate:  [55-91] 91 (02/05 0648) Resp:  [16-19] 18 (02/05 0648) BP: (109-149)/(70-126) 128/70 (02/05 0648) SpO2:  [96 %-100 %] 97 % (02/05 0648) Last BM Date: 07/07/21  Intake/Output from previous day: 02/04 0701 - 02/05 0700 In: 1833.8 [P.O.:1680; I.V.:153.8] Out: 1150 [Urine:1150] Intake/Output this shift: Total I/O In: 432.2 [P.O.:240; I.V.:192.2] Out: -   General: WD, morbidly obese female NAD Lungs: Respiratory effort nonlabored Abd: soft, incision c/d/I; staple line intact Psych: A&Ox3 with an appropriate affect.   Lab Results:  Recent Labs    07/14/21 0103 07/15/21 0500  WBC 8.0 8.8  HGB 13.7 13.7  HCT 42.9 43.2  PLT 193 199   BMET No results for input(s): NA, K, CL, CO2, GLUCOSE, BUN, CREATININE, CALCIUM in the last 72 hours. PT/INR Recent Labs    07/14/21 0103 07/15/21 0500  LABPROT 17.0* 17.0*  INR 1.4* 1.4*   ABG No results for input(s): PHART, HCO3 in the last 72 hours.  Invalid input(s): PCO2, PO2  Studies/Results: No results found.  Anti-infectives: Anti-infectives (From admission, onward)    Start     Dose/Rate Route Frequency Ordered Stop   07/10/21 1600  ceFAZolin (ANCEF) IVPB 3g/100 mL premix        3 g 200 mL/hr over 30 Minutes Intravenous  Once 07/10/21 1556 07/10/21 1615       Assessment/Plan: POD 5 s/p ventral hernia repair 07/10/21 Dr. Thermon Leyland - passing flatus, advance to regular diet - planning SNF on discharge, continue to mobilize as tolerated - continue to work on pain control - transition to PO morphine with IV morphine only for breakthrough - continue binder - order a smaller binder - Benadryl PRn for itching -  patient may shower   FEN: reg diet, SLIV VTE: SCDs, heparin gtt, back on Warfarin.  Stop heparin when INR >2.0 ID: Ancef pre-op   Hx of DVT/PE on warfarin - heparin gtt -  transition to warfarint  Leukemia - in remission  Arthritis      LOS: 6 days    Maia Petties 07/15/2021

## 2021-07-16 LAB — BPAM FFP
Blood Product Expiration Date: 202302052359
Unit Type and Rh: 7300

## 2021-07-16 LAB — PROTIME-INR
INR: 1.5 — ABNORMAL HIGH (ref 0.8–1.2)
Prothrombin Time: 18.3 seconds — ABNORMAL HIGH (ref 11.4–15.2)

## 2021-07-16 LAB — PREPARE FRESH FROZEN PLASMA

## 2021-07-16 LAB — CBC
HCT: 41.4 % (ref 36.0–46.0)
Hemoglobin: 13.2 g/dL (ref 12.0–15.0)
MCH: 32.4 pg (ref 26.0–34.0)
MCHC: 31.9 g/dL (ref 30.0–36.0)
MCV: 101.5 fL — ABNORMAL HIGH (ref 80.0–100.0)
Platelets: 199 10*3/uL (ref 150–400)
RBC: 4.08 MIL/uL (ref 3.87–5.11)
RDW: 13.2 % (ref 11.5–15.5)
WBC: 8.7 10*3/uL (ref 4.0–10.5)
nRBC: 0 % (ref 0.0–0.2)

## 2021-07-16 LAB — HEPARIN LEVEL (UNFRACTIONATED): Heparin Unfractionated: 0.39 IU/mL (ref 0.30–0.70)

## 2021-07-16 MED ORDER — WARFARIN SODIUM 5 MG PO TABS
7.5000 mg | ORAL_TABLET | Freq: Once | ORAL | Status: AC
  Administered 2021-07-16: 7.5 mg via ORAL
  Filled 2021-07-16: qty 1

## 2021-07-16 NOTE — Progress Notes (Signed)
OT Cancellation Note  Patient Details Name: NAUTICA HOTZ MRN: [id number] DOB: Feb 14, 1951   Cancelled Treatment:    Reason Eval/Treat Not Completed: Other (comment) (pt declined, reported she ambulated earlier today and wants to rest. Declined ADL tasks. Wishes respected. Will re-attempt as able.Shanon Payor, OTD OTR/L  07/16/21, 2:38 PM

## 2021-07-16 NOTE — Progress Notes (Signed)
[place] for heparin bridge to warfarin Indication: history of DVT/PE  Allergies  Allergen Reactions   Iodides Anaphylaxis, Hives, Swelling and Other (See Comments)    WITH CONTRAST   Iodinated Contrast Media Anaphylaxis, Hives, Swelling and Other (See Comments)    Per pt, happened in 2012   Sulfa Antibiotics Itching, Swelling, Rash and Other (See Comments)    Rash (possibly from leukemia?)   Sulfamethoxazole Itching, Swelling and Rash    Patient Measurements: Height: 5\' 5"  (165.1 cm) Weight: (!) 159 kg (350 lb 8.5 oz) IBW/kg (Calculated) : 57 Heparin Dosing Weight: 97.3 kg  Vital Signs: Temp: 98.1 F (36.7 C) (02/06 0543) Temp Source: Oral (02/06 0543) BP: 141/50 (02/06 0543) Pulse Rate: 87 (02/06 0543)  Labs: Recent Labs    07/14/21 0103 07/14/21 1029 07/14/21 2227 07/15/21 0500 07/16/21 0417  HGB 13.7  --   --  13.7 13.2  HCT 42.9  --   --  43.2 41.4  PLT 193  --   --  199 199  LABPROT 17.0*  --   --  17.0* 18.3*  INR 1.4*  --   --  1.4* 1.5*  HEPARINUNFRC 0.96*   < > 0.47 0.46 0.39   < > = values in this interval not displayed.     Estimated Creatinine Clearance: 97.6 mL/min (by C-G formula based on SCr of 0.84 mg/dL).   Assessment: 71 year-old-female who underwent ventral hernia repair on 07/10/21.  Patient is on warfarin PTA for history of DVT/PE.  Patient received FFP on 1/31 to reverse warfarin prior to surgery. Last dose of warfarin 1/27. Patient takes warfarin 4mg  daily at home.  Pharmacy consulted to dose heparin drip on 2/2 and consulted to bridge to warfarin on 2/3.   Today, 07/16/21 Heparin level remains stable and therapeutic on 1400 units/hr INR subtherapeutic but has stopped trending down after resuming boosted warfarin No bleeding or infusion issues documented CBC: Hg/pltc WNL No major drug-drug interactions with warfarin noted Charting ~50-75% of meals eaten  Goal of Therapy:  INR 2-3 Heparin level  0.3-0.7 units/ml INR goal 2-3 Monitor platelets by anticoagulation protocol: Yes   Plan: Continue heparin at 1400 units/hr Increase warfarin 7.5mg  PO x1 tonight; would transition to 4 mg daily once INR begins to trend back up D/c heparin infusion once INR > 2 Monitor daily PT/INR, heparin level and CBC  Peggyann Juba, PharmD, BCPS Pharmacy: [phone] 07/16/2021, 7:16 AM

## 2021-07-16 NOTE — Care Management Important Message (Signed)
Important Message  Patient Details IM Letter given to the Patient. Name: Ana Jones MRN: [id number] Date of Birth: Sep 13, 1950   Medicare Important Message Given:  Yes     Kerin Salen 07/16/2021, 3:10 PM

## 2021-07-16 NOTE — NC FL2 (Signed)
[place] LEVEL OF CARE SCREENING TOOL     IDENTIFICATION  Patient Name: Ana Jones Birthdate: 12-31-1950 Sex: female Admission Date (Current Location): 07/09/2021  [facility] and [place] Number:  Herbalist and Address:  [facility],  [place], [place]      Provider Number: [id number]  Attending Physician Name and Address:  Edison Pace, Md, MD  Relative Name and Phone Number:  Nanie Dunkleberger (son) Ph: [phone]    Current Level of Care: Hospital Recommended Level of Care: [place] Prior Approval Number:    Date Approved/Denied:   PASRR Number: [id number]  Discharge Plan: SNF    Current Diagnoses: Patient Active Problem List   Diagnosis Date Noted   Incarcerated umbilical hernia 24/02/7352   Night muscle spasms 12/30/2015   Overactive bladder 12/30/2015   Chronic sciatica of right side 12/30/2015   Dyspnea 12/14/2015   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[place]) 12/14/2015   Obesity 12/14/2015   History of myeloid leukemia 12/14/2015   History of DVT (deep vein thrombosis) 12/14/2015   PE (pulmonary thromboembolism) ([place]) 12/14/2015    Orientation RESPIRATION BLADDER Height & Weight     Self, Time, Situation, Place  Normal Continent Weight: (!) 350 lb 8.5 oz (159 kg) Height:  5\' 5"  (165.1 cm)  BEHAVIORAL SYMPTOMS/MOOD NEUROLOGICAL BOWEL NUTRITION STATUS   (N/A)  (N/A) Continent Diet (Regular diet)  AMBULATORY STATUS COMMUNICATION OF NEEDS Skin   Limited Assist Verbally Surgical wounds                       Personal Care Assistance Level of Assistance  Bathing, Feeding, Dressing Bathing Assistance: Limited assistance Feeding assistance: Independent Dressing Assistance: Limited assistance     Functional Limitations Info  Sight, Hearing, Speech Sight Info: Impaired Hearing Info: Adequate Speech Info: Adequate    SPECIAL CARE FACTORS FREQUENCY  PT (By licensed PT), OT (By licensed  OT)     PT Frequency: 5x's/week OT Frequency: 5x's/week            Contractures Contractures Info: Not present    Additional Factors Info  Code Status, Allergies Code Status Info: Full Allergies Info: Iodides, Iodinated Contrast Media, Sulfa Antibiotics, Sulfamethoxazole           Current Medications (07/16/2021):  This is the current hospital active medication list Current Facility-Administered Medications  Medication Dose Route Frequency Provider Last Rate Last Admin   0.9 %  sodium chloride infusion (Manually program via Guardrails IV Fluids)   Intravenous Once Jillyn Ledger, PA-C       acetaminophen (TYLENOL) tablet 1,000 mg  1,000 mg Oral QPC breakfast Norm Parcel, PA-C   1,000 mg at 07/16/21 0848   cyclobenzaprine (FLEXERIL) tablet 10 mg  10 mg Oral TID PRN Norm Parcel, PA-C   10 mg at 07/14/21 0024   diphenhydrAMINE (BENADRYL) capsule 25 mg  25 mg Oral Q6H PRN Donnie Mesa, MD   25 mg at 07/16/21 0416   heparin ADULT infusion 100 units/mL (25000 units/272mL)  1,400 Units/hr Intravenous Continuous Thomes Lolling, RPH 14 mL/hr at 07/16/21 0844 1,400 Units/hr at 07/16/21 0844   magnesium oxide (MAG-OX) tablet 200 mg  200 mg Oral Daily PRN Norm Parcel, PA-C       morphine (MSIR) tablet 15 mg  15 mg Oral Q4H PRN Donnie Mesa, MD   15 mg at 07/16/21 0437   morphine (PF) 2 MG/ML injection 2 mg  2 mg Intravenous Q3H PRN Donnie Mesa, MD   2 mg at 07/15/21 2205   ondansetron (ZOFRAN-ODT) disintegrating tablet 4 mg  4 mg Oral Q6H PRN Autumn Messing III, MD       Or   ondansetron [facility]) injection 4 mg  4 mg Intravenous Q6H PRN Autumn Messing III, MD   4 mg at 07/10/21 0738   pantoprazole (PROTONIX) injection 40 mg  40 mg Intravenous QHS Autumn Messing III, MD   40 mg at 07/15/21 2205   simethicone (MYLICON) chewable tablet 80 mg  80 mg Oral Q6H PRN Norm Parcel, PA-C       sodium chloride (OCEAN) 0.65 % nasal spray 1 spray  1 spray Each Nare PRN Stechschulte,  Nickola Major, MD   1 spray at 07/11/21 0208   topiramate (TOPAMAX) tablet 50 mg  50 mg Oral QHS Norm Parcel, PA-C   50 mg at 07/15/21 2206   traMADol (ULTRAM) tablet 100 mg  100 mg Oral QHS Norm Parcel, PA-C   100 mg at 07/15/21 2205   warfarin (COUMADIN) tablet 7.5 mg  7.5 mg Oral ONCE-1600 Emiliano Dyer, [facility]       Warfarin - Pharmacist Dosing Inpatient   Does not apply q1600 Dimple Nanas, [facility]   1 each at 07/15/21 1606     Discharge Medications: Please see discharge summary for a list of discharge medications.  Relevant Imaging Results:  Relevant Lab Results:   Additional Information SSN: [id number]  Sherie Don, LCSW

## 2021-07-16 NOTE — Discharge Summary (Signed)
[facility] Discharge Summary   Patient ID: Ana Jones MRN: [id number] DOB/AGE: 1950/09/02 71 y.o.  Admit date: 07/09/2021 Discharge date: 07/19/2021  Admitting Diagnosis: Incarcerated umbilical hernia   Discharge Diagnosis S/p ventral hernia repair 07/10/21 Dr. 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 Status post ventral hernia repair on 07/10/2021. EXAM: CT ABDOMEN AND PELVIS WITHOUT CONTRAST TECHNIQUE: Multidetector CT imaging of the abdomen and pelvis was performed following the standard protocol without IV contrast. RADIATION DOSE REDUCTION: This exam was performed according to the departmental dose-optimization program which includes automated exposure control, adjustment of the mA and/or kV according to patient size and/or use of iterative reconstruction technique. COMPARISON:  07/09/2021. FINDINGS: Lower chest: Minimal bibasilar linear atelectasis. Stable interatrial lipoma. This measures 3.0 x 2.8 cm and -21 Hounsfield units in density on image number 5/2 and 4.3 cm in length on coronal image number 70/4. Hepatobiliary: No focal liver abnormality is seen. No gallstones, gallbladder wall thickening, or biliary dilatation. Pancreas: Unremarkable. No pancreatic ductal dilatation or surrounding inflammatory changes. Spleen: Small calcified granulomata.  Normal in size and shape. Adrenals/Urinary Tract: Adrenal glands are unremarkable. Kidneys are normal, without renal calculi, focal lesion, or hydronephrosis. Bladder is unremarkable. Stomach/Bowel: Mild sigmoid diverticulosis without evidence of diverticulitis. Unremarkable stomach, small bowel and appendix. Vascular/Lymphatic: Mild atheromatous arterial calcifications without aneurysm. No enlarged lymph nodes. Reproductive: Status post hysterectomy. No adnexal  masses. Other: Interval repair of the previously demonstrated ventral hernia. There is an oval fluid and gas collection at the location of the previously demonstrated hernia sac, measuring 10.5 x 4.3 cm on image number 65/2 and 7.9 cm in length on sagittal image number 97/5. There is a tiny amount of fluid and gas at that level more anteriorly. Also demonstrated is mild soft tissue stranding in the adjacent subcutaneous fat. Musculoskeletal: Lumbar and lower thoracic spine degenerative changes and mild levoconvex scoliosis. IMPRESSION: 1. Status post ventral hernia repair with a 10.5 x 7.9 x 4.3 cm fluid and gas collection in the subcutaneous fat at the location of the previously demonstrated hernia sac with a tiny amount of fluid and gas more superficially at that level. Differential considerations include a postoperative seroma and residual air or developing abscess. 2. Mild sigmoid diverticulosis. 3. 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 Electronically Signed   By: Claudie Revering M.D.   On: 07/19/2021 11:32    Procedures Dr. Eddie Dibbles Stechschulte (07/10/21) -  4cm x 4 cm PRIMARY VENTRAL UMBILICAL HERNIA REPAIR WITH #1 [place] Hospital Course:  Patient is a morbidly obese female who presented to the ED with known umbilical hernia that has been out for a week with increased pain.  Workup showed incarcerated hernia with colon in the hernia. Patient also on coumadin for history of PE/DVT (2017) but had stopped taking it a few days prior to presentation.  Patient was admitted and underwent procedure listed above. Transfused FFP prior to OR. Tolerated procedure well and was transferred to the floor.  Diet was advanced as tolerated. Pharmacy assisted with transition from heparin gtt post-op back to coumadin. Repeat CT 2/9 w/ post-operative seroma and no recurrent hernia. On POD9, the patient was voiding well, tolerating diet, working with therapies, pain controlled, vital signs  stable, incisions c/d/i and felt stable for discharge to SNF  as recommended by PT/OT.  Patient will follow up in our office and was recommended to follow up with PCP as listed below.   I or a member of my team have reviewed this patient in the Controlled Substance Database.   Allergies as of 07/19/2021       Reactions   Iodides Anaphylaxis, Hives, Swelling, Other (See Comments)   WITH CONTRAST   Iodinated Contrast Media Anaphylaxis, Hives, Swelling, Other (See Comments)   Per pt, happened in 2012   Sulfa Antibiotics Itching, Swelling, Rash, Other (See Comments)   Rash (possibly from leukemia?)   Sulfamethoxazole Itching, Swelling, Rash        Medication List     STOP taking these medications    oxybutynin 10 MG 24 hr tablet Commonly known as: DITROPAN-XL       TAKE these medications    acetaminophen 500 MG tablet Commonly known as: TYLENOL Take 1,000 mg by mouth in the morning.   cyclobenzaprine 10 MG tablet Commonly known as: FLEXERIL Take 10 mg by mouth daily as needed for muscle spasms (or cramping).   dextromethorphan-guaiFENesin 30-600 MG 12hr tablet Commonly known as: MUCINEX DM Take 1 tablet by mouth 2 (two) times daily as needed for cough.   diphenhydrAMINE 25 mg capsule Commonly known as: BENADRYL Take 1 capsule (25 mg total) by mouth every 6 (six) hours as needed for itching.   MAGNESIUM PO Take 1-2 tablets by mouth as needed (for leg cramps).   morphine 15 MG tablet Commonly known as: MSIR Take 1 tablet (15 mg total) by mouth every 6 (six) hours as needed for severe pain.   nystatin 100000 UNIT/ML suspension Commonly known as: MYCOSTATIN Take 5 mLs (500,000 Units total) by mouth 4 (four) times daily for 5 days.   predniSONE 5 MG tablet Commonly known as: DELTASONE Take 5 mg by mouth daily with breakfast.   sodium chloride 0.65 % Soln nasal spray Commonly known as: OCEAN Place 1 spray into both nostrils as needed for congestion.   topiramate  50 MG tablet Commonly known as: TOPAMAX Take 50 mg by mouth at bedtime.   traMADol 100 MG 24 hr tablet Commonly known as: ULTRAM-ER Take 100 mg by mouth at bedtime.   triamcinolone cream 0.1 % Commonly known as: KENALOG Apply 1 application topically 2 (two) times daily as needed (AS DIRECTED for skin irritations).   warfarin 4 MG tablet Commonly known as: COUMADIN Take 4 mg by mouth at bedtime.          Follow-up Information     Stechschulte, Nickola Major, MD. Go on 08/02/2021.   Specialty: Surgery Why: 2:20 PM. Please arrive 30 min prior to appointment time. Bring photo ID and insurance information. Contact information: [place]. [address] [place] [address] [phone]         Manfred Shirts, [place]. Call.   Specialty: General Practice Why: Please call your Primary Care Provider to arrange after hospitalization follow up. You had 2 listed in your records Contact information: [place] [address] [phone]         Dr. Alver Fisher. Call.   Why: Please call your Primary Care Provider to arrange after hospitalization follow up. You had 2 listed in your records Contact information: [facility] [address] [place],  [address] [phone]                Signed: Norm Parcel , [facility] 07/19/2021,  12:46 PM Please see Amion for pager number during day hours 7:00am-4:30pm

## 2021-07-16 NOTE — Progress Notes (Signed)
° °  Progress Note  6 Days Post-Op  Subjective: Pt tolerating diet and reports appetite is significantly improved. She is having bowel function. She wants to get off heparin gtt.   Objective: Vital signs in last 24 hours: Temp:  [97.5 F (36.4 C)-98.3 F (36.8 C)] 98.1 F (36.7 C) (02/06 0543) Pulse Rate:  [78-91] 87 (02/06 0543) Resp:  [16-18] 18 (02/06 0543) BP: (121-141)/(50-75) 141/50 (02/06 0543) SpO2:  [95 %-100 %] 96 % (02/06 0543) Last BM Date: 07/07/21  Intake/Output from previous day: 02/05 0701 - 02/06 0700 In: 1886.7 [P.O.:1440; I.V.:446.7] Out: 1670 [Urine:1670] Intake/Output this shift: Total I/O In: 614 [P.O.:520; I.V.:94] Out: 100 [Urine:100]  PE: General: WD, morbidly obese female who is up in chair in NAD Lungs: Respiratory effort nonlabored Abd: soft, appropriately ttp, incision is C/D/I  Psych: A&Ox3 with an appropriate affect.    Lab Results:  Recent Labs    07/15/21 0500 07/16/21 0417  WBC 8.8 8.7  HGB 13.7 13.2  HCT 43.2 41.4  PLT 199 199   BMET No results for input(s): NA, K, CL, CO2, GLUCOSE, BUN, CREATININE, CALCIUM in the last 72 hours. PT/INR Recent Labs    07/15/21 0500 07/16/21 0417  LABPROT 17.0* 18.3*  INR 1.4* 1.5*   CMP     Component Value Date/Time   NA 137 07/12/2021 0431   NA 141 12/15/2015 0000   K 3.7 07/12/2021 0431   CL 107 07/12/2021 0431   CO2 24 07/12/2021 0431   GLUCOSE 107 (H) 07/12/2021 0431   BUN 19 07/12/2021 0431   BUN 11 12/15/2015 0000   CREATININE 0.84 07/12/2021 0431   CALCIUM 8.4 (L) 07/12/2021 0431   PROT 7.4 07/09/2021 1925   ALBUMIN 4.0 07/09/2021 1925   AST 16 07/09/2021 1925   ALT 18 07/09/2021 1925   ALKPHOS 96 07/09/2021 1925   BILITOT 1.3 (H) 07/09/2021 1925   GFRNONAA >60 07/12/2021 0431   GFRAA >60 11/18/2017 1438   Lipase     Component Value Date/Time   LIPASE 27 07/09/2021 1925       Studies/Results: No results found.  Anti-infectives: Anti-infectives (From  admission, onward)    Start     Dose/Rate Route Frequency Ordered Stop   07/10/21 1600  ceFAZolin (ANCEF) IVPB 3g/100 mL premix        3 g 200 mL/hr over 30 Minutes Intravenous  Once 07/10/21 1556 07/10/21 1615        Assessment/Plan POD 6 s/p ventral hernia repair 07/10/21 Dr. Thermon Leyland - tolerating diet and having bowel function  - planning SNF on discharge vs home with help if unable to place - continue PO morphine with IV morphine only for breakthrough - binder prn for comfort - Benadryl PRN for itching - patient may shower - stable for discharge when dispo arranged    FEN: reg diet, SLIV VTE: SCDs, heparin gtt, back on Warfarin.  Stop heparin when INR >2.0 ID: Ancef pre-op   Hx of DVT/PE on warfarin - pharmacy working on getting back on warfarin Leukemia - in remission  Arthritis    LOS: 7 days   I reviewed nursing notes, last 24 h vitals and pain scores, last 48 h intake and output, and last 24 h labs and trends.  This care required low level of medical decision making.    Norm Parcel, [facility] 07/16/2021, 11:03 AM Please see Amion for pager number during day hours 7:00am-4:30pm

## 2021-07-16 NOTE — Progress Notes (Signed)
Physical Therapy Treatment Patient Details Name: Ana Jones MRN: [id number] DOB: 01-07-1951 Today's Date: 07/16/2021   History of Present Illness Patient is a 71 year old  female who presents with an umbilical hernia , S/PHERNIA REPAIR VENTRAL on 07/10/2021. WLN:[id number], DVT, PE, polio, sleep apnea, OA.    PT Comments    Progressing with mobility. Plan is for ST SNF rehab.    Recommendations for follow up therapy are one component of a multi-disciplinary discharge planning process, led by the attending physician.  Recommendations may be updated based on patient status, additional functional criteria and insurance authorization.  Follow Up Recommendations  Skilled nursing-short term rehab (<3 hours/day)     Assistance Recommended at Discharge Intermittent Supervision/Assistance  Patient can return home with the following A little help with walking and/or transfers;A lot of help with bathing/dressing/bathroom;Assistance with cooking/housework;Assist for transportation;Help with stairs or ramp for entrance   Equipment Recommendations  None recommended by PT    Recommendations for Other Services       Precautions / Restrictions Precautions Precautions: Fall Precaution Comments: reports LLE weak from post polio Restrictions Weight Bearing Restrictions: No     Mobility  Bed Mobility               General bed mobility comments: oob in recliner    Transfers Overall transfer level: Needs assistance Equipment used: Rolling walker (2 wheels) Transfers: Sit to/from Stand Sit to Stand: Min guard           General transfer comment: increased time. uses momentum. cues for safety.    Ambulation/Gait Ambulation/Gait assistance: Min assist Gait Distance (Feet): 125 Feet Assistive device: Rolling walker (2 wheels) Gait Pattern/deviations: Step-through pattern, Decreased stride length       General Gait Details: Intermittent assist to steady. 2 brief standing rest  breaks taken/needed. Dyspnea 3/4.   Stairs             Wheelchair Mobility    Modified Rankin (Stroke Patients Only)       Balance Overall balance assessment: Needs assistance         Standing balance support: Bilateral upper extremity supported, Reliant on assistive device for balance, During functional activity Standing balance-Leahy Scale: Fair                              Cognition Arousal/Alertness: Awake/alert Behavior During Therapy: WFL for tasks assessed/performed Overall Cognitive Status: Within Functional Limits for tasks assessed                                          Exercises LAQs, 20 reps, seated Toe raises, 10 reps, seated     General Comments        Pertinent Vitals/Pain Pain Assessment Pain Assessment: Faces Faces Pain Scale: Hurts little more Pain Location: abdomen, Pain Descriptors / Indicators: Discomfort, Sore Pain Intervention(s): Monitored during session    Home Living                          Prior Function            PT Goals (current goals can now be found in the care plan section) Progress towards PT goals: Progressing toward goals    Frequency    Min 2X/week      PT Plan Current plan remains  appropriate    Co-evaluation              AM-PAC PT "6 Clicks" Mobility   Outcome Measure  Help needed turning from your back to your side while in a flat bed without using bedrails?: A Lot Help needed moving from lying on your back to sitting on the side of a flat bed without using bedrails?: A Little Help needed moving to and from a bed to a chair (including a wheelchair)?: A Little Help needed standing up from a chair using your arms (e.g., wheelchair or bedside chair)?: A Little Help needed to walk in hospital room?: A Little Help needed climbing 3-5 steps with a railing? : A Lot 6 Click Score: 16    End of Session   Activity Tolerance: Patient tolerated treatment  well Patient left: in chair;with call bell/phone within reach   PT Visit Diagnosis: Unsteadiness on feet (R26.81);Difficulty in walking, not elsewhere classified (R26.2)     Time: 0973-5329 PT Time Calculation (min) (ACUTE ONLY): 37 min  Charges:  $Gait Training: 23-37 mins                         Doreatha Massed, PT Acute Rehabilitation  Office: [phone] Pager: [phone]

## 2021-07-17 LAB — CBC
HCT: 40.8 % (ref 36.0–46.0)
Hemoglobin: 12.6 g/dL (ref 12.0–15.0)
MCH: 32.1 pg (ref 26.0–34.0)
MCHC: 30.9 g/dL (ref 30.0–36.0)
MCV: 103.8 fL — ABNORMAL HIGH (ref 80.0–100.0)
Platelets: 201 10*3/uL (ref 150–400)
RBC: 3.93 MIL/uL (ref 3.87–5.11)
RDW: 13.4 % (ref 11.5–15.5)
WBC: 8.7 10*3/uL (ref 4.0–10.5)
nRBC: 0 % (ref 0.0–0.2)

## 2021-07-17 LAB — HEPARIN LEVEL (UNFRACTIONATED): Heparin Unfractionated: 0.35 IU/mL (ref 0.30–0.70)

## 2021-07-17 LAB — PROTIME-INR
INR: 1.7 — ABNORMAL HIGH (ref 0.8–1.2)
Prothrombin Time: 19.5 seconds — ABNORMAL HIGH (ref 11.4–15.2)

## 2021-07-17 MED ORDER — GUAIFENESIN ER 600 MG PO TB12
600.0000 mg | ORAL_TABLET | Freq: Two times a day (BID) | ORAL | Status: DC | PRN
  Administered 2021-07-17: 600 mg via ORAL
  Filled 2021-07-17: qty 1

## 2021-07-17 MED ORDER — WARFARIN SODIUM 2.5 MG PO TABS
7.5000 mg | ORAL_TABLET | Freq: Once | ORAL | Status: AC
  Administered 2021-07-17: 7.5 mg via ORAL
  Filled 2021-07-17: qty 1

## 2021-07-17 NOTE — TOC Progression Note (Signed)
Transition of Care [facility]) - Progression Note   Patient Details  Name: Ana Jones MRN: [id number] Date of Birth: Jan 12, 1951  Transition of Care [facility]) CM/SW [place], LCSW Phone Number: 07/17/2021, 2:12 PM  Clinical Narrative: CSW followed up with Lexine Baton at [facility] and the facility is unable to make a bed offer at this time. Patient is agreeable to bed offer from [facility]. CSW followed up with Lorenza Chick at [facility] and the facility can accept the patient tomorrow, however, there is an open Worker's Comp benefit on the patient's Medicare and will need to be address by the patient prior to admission.  CSW met with patient to assist with calling Medicare to correct the issue. 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 Patient to follow up with her employer and will have the paperwork for the claims closure faxed to Medicare. CSW updated Lorenza Chick.  CSW completed insurance authorization in [place] portal. Reference ID # is: [id number]. Patient has been approved for 07/18/21-07/20/21. TOC to follow.  Expected Discharge Plan: [place] Barriers to Discharge: Continued Medical Work up, SNF Pending bed offer, Ship broker  Expected Discharge Plan and Services Expected Discharge Plan: [place] In-house Referral: Clinical Social Work Post Acute Care Choice: [place] Living arrangements for the past 2 months: Single Family Home          DME Arranged: N/A DME Agency: NA  Readmission Risk Interventions No flowsheet data found.

## 2021-07-17 NOTE — Progress Notes (Signed)
° °  Progress Note  7 Days Post-Op  Subjective: Pt sleeping in chair this AM and not very participatory but able to tell me she is not having much pain. Per nursing staff she was awake a lot overnight.   Objective: Vital signs in last 24 hours: Temp:  [97.4 F (36.3 C)-98.4 F (36.9 C)] 98.4 F (36.9 C) (02/07 0624) Pulse Rate:  [58-87] 87 (02/07 0624) Resp:  [18] 18 (02/07 0624) BP: (108-137)/(57-67) 137/67 (02/07 0624) SpO2:  [97 %-100 %] 98 % (02/07 0624) Last BM Date: 07/16/21  Intake/Output from previous day: 02/06 0701 - 02/07 0700 In: 973.4 [P.O.:820; I.V.:153.4] Out: 600 [Urine:600] Intake/Output this shift: No intake/output data recorded.  PE: General: WD, morbidly obese female who is up in chair in NAD Lungs: Respiratory effort nonlabored Abd: soft, NT    Lab Results:  Recent Labs    07/16/21 0417 07/17/21 0437  WBC 8.7 8.7  HGB 13.2 12.6  HCT 41.4 40.8  PLT 199 201   BMET No results for input(s): NA, K, CL, CO2, GLUCOSE, BUN, CREATININE, CALCIUM in the last 72 hours. PT/INR Recent Labs    07/16/21 0417 07/17/21 0437  LABPROT 18.3* 19.5*  INR 1.5* 1.7*   CMP     Component Value Date/Time   NA 137 07/12/2021 0431   NA 141 12/15/2015 0000   K 3.7 07/12/2021 0431   CL 107 07/12/2021 0431   CO2 24 07/12/2021 0431   GLUCOSE 107 (H) 07/12/2021 0431   BUN 19 07/12/2021 0431   BUN 11 12/15/2015 0000   CREATININE 0.84 07/12/2021 0431   CALCIUM 8.4 (L) 07/12/2021 0431   PROT 7.4 07/09/2021 1925   ALBUMIN 4.0 07/09/2021 1925   AST 16 07/09/2021 1925   ALT 18 07/09/2021 1925   ALKPHOS 96 07/09/2021 1925   BILITOT 1.3 (H) 07/09/2021 1925   GFRNONAA >60 07/12/2021 0431   GFRAA >60 11/18/2017 1438   Lipase     Component Value Date/Time   LIPASE 27 07/09/2021 1925       Studies/Results: No results found.  Anti-infectives: Anti-infectives (From admission, onward)    Start     Dose/Rate Route Frequency Ordered Stop   07/10/21 1600   ceFAZolin (ANCEF) IVPB 3g/100 mL premix        3 g 200 mL/hr over 30 Minutes Intravenous  Once 07/10/21 1556 07/10/21 1615        Assessment/Plan POD7 s/p ventral hernia repair 07/10/21 Dr. Thermon Leyland - tolerating diet and having bowel function  - planning SNF on discharge vs home with help if unable to place - continue PO morphine with IV morphine only for breakthrough - binder prn for comfort - Benadryl PRN for itching - patient may shower - stable for discharge when dispo arranged    FEN: reg diet, SLIV VTE: SCDs, heparin gtt, back on Warfarin.  Stop heparin when INR >2.0 ID: Ancef pre-op   Hx of DVT/PE on warfarin - pharmacy working on getting back on warfarin, INR 1.7 this AM Leukemia - in remission  Arthritis    LOS: 8 days   I reviewed last 24 h vitals and pain scores, last 48 h intake and output, and last 24 h labs and trends.  This care required low level of medical decision making.    Norm Parcel, [facility] Surgery 07/17/2021, 9:11 AM Please see Amion for pager number during day hours 7:00am-4:30pm

## 2021-07-17 NOTE — Progress Notes (Signed)
[place] for heparin bridge to warfarin Indication: history of DVT/PE  Allergies  Allergen Reactions   Iodides Anaphylaxis, Hives, Swelling and Other (See Comments)    WITH CONTRAST   Iodinated Contrast Media Anaphylaxis, Hives, Swelling and Other (See Comments)    Per pt, happened in 2012   Sulfa Antibiotics Itching, Swelling, Rash and Other (See Comments)    Rash (possibly from leukemia?)   Sulfamethoxazole Itching, Swelling and Rash    Patient Measurements: Height: 5\' 5"  (165.1 cm) Weight: (!) 159 kg (350 lb 8.5 oz) IBW/kg (Calculated) : 57 Heparin Dosing Weight: 97.3 kg  Vital Signs: Temp: 98.4 F (36.9 C) (02/07 0624) BP: 137/67 (02/07 0624) Pulse Rate: 87 (02/07 0624)  Labs: Recent Labs    07/15/21 0500 07/16/21 0417 07/17/21 0437  HGB 13.7 13.2 12.6  HCT 43.2 41.4 40.8  PLT 199 199 201  LABPROT 17.0* 18.3* 19.5*  INR 1.4* 1.5* 1.7*  HEPARINUNFRC 0.46 0.39 0.35     Estimated Creatinine Clearance: 97.6 mL/min (by C-G formula based on SCr of 0.84 mg/dL).   Assessment: 71 year-old-female who underwent ventral hernia repair on 07/10/21.  Patient is on warfarin PTA for history of DVT/PE.  Patient received FFP on 1/31 to reverse warfarin prior to surgery. Last dose of warfarin 1/27. Patient takes warfarin 4mg  daily at home.  Pharmacy consulted to dose heparin drip on 2/2 and consulted to bridge to warfarin on 2/3.   Today, 07/17/21 Heparin level remains stable and therapeutic on 1400 units/hr INR subtherapeutic but slowly increasing resuming boosted warfarin No bleeding or infusion issues documented CBC: Hg/pltc WNL No major drug-drug interactions with warfarin noted Charting 100% of meals eaten  Goal of Therapy:  INR 2-3 Heparin level 0.3-0.7 units/ml INR goal 2-3 Monitor platelets by anticoagulation protocol: Yes   Plan: Continue heparin at 1400 units/hr Repeat warfarin 7.5mg  PO x1 tonight D/c heparin infusion  once INR > 2 Monitor daily PT/INR, heparin level and CBC  Peggyann Juba, PharmD, BCPS Pharmacy: [phone] 07/17/2021, 7:13 AM

## 2021-07-17 NOTE — Progress Notes (Signed)
°   07/17/21 1400  Mobility  Activity Transferred to/from [facility]  Level of Assistance Contact guard assist, steadying assist  Assistive Device Front wheel walker;BSC  Activity Response Tolerated well  $Mobility charge 1 Mobility   Pt agreeable to mobilize this afternoon. Stated she needed to have a BM. Assisted pt to and from [facility]. Tolerated well. NT notified of session. Once finished, pt requested to rest as she has had a very stressful day mentally.  [place] Specialist Acute Rehab Services Office: [phone]

## 2021-07-17 NOTE — Progress Notes (Signed)
This RN was called by nurse secretary patient needed to use restroom at [employer]. RN was with another patient at the time of call and stated RN would go there next. Patient called back at 1932 and 1933. RN entered patients room at 1935 to assist to restroom. Was met with hostile attitude and verbiage. RN tried to explain to patient she was tied up in another room and came as soon as possible. Patient stated "I do not care about other patients, I have needs and it sounds like you need to talk to your supervisor about that." Patient fired RN. This RN asked charge Richelle to speak with patient.

## 2021-07-18 LAB — CBC
HCT: 40.3 % (ref 36.0–46.0)
Hemoglobin: 12.6 g/dL (ref 12.0–15.0)
MCH: 32.6 pg (ref 26.0–34.0)
MCHC: 31.3 g/dL (ref 30.0–36.0)
MCV: 104.1 fL — ABNORMAL HIGH (ref 80.0–100.0)
Platelets: 197 10*3/uL (ref 150–400)
RBC: 3.87 MIL/uL (ref 3.87–5.11)
RDW: 14 % (ref 11.5–15.5)
WBC: 8.1 10*3/uL (ref 4.0–10.5)
nRBC: 0 % (ref 0.0–0.2)

## 2021-07-18 LAB — PROTIME-INR
INR: 1.9 — ABNORMAL HIGH (ref 0.8–1.2)
Prothrombin Time: 21.6 seconds — ABNORMAL HIGH (ref 11.4–15.2)

## 2021-07-18 LAB — HEPARIN LEVEL (UNFRACTIONATED)
Heparin Unfractionated: 0.19 IU/mL — ABNORMAL LOW (ref 0.30–0.70)
Heparin Unfractionated: 0.36 IU/mL (ref 0.30–0.70)

## 2021-07-18 MED ORDER — WARFARIN SODIUM 5 MG PO TABS: 5.0000 mg | ORAL_TABLET | Freq: Once | ORAL | Status: DC

## 2021-07-18 MED ORDER — DM-GUAIFENESIN ER 30-600 MG PO TB12
1.0000 | ORAL_TABLET | Freq: Two times a day (BID) | ORAL | Status: DC | PRN
  Administered 2021-07-18 – 2021-07-19 (×3): 1 via ORAL
  Filled 2021-07-18 (×3): qty 1

## 2021-07-18 MED ORDER — ALBUTEROL SULFATE (2.5 MG/3ML) 0.083% IN NEBU: 3.0000 mL | INHALATION_SOLUTION | Freq: Four times a day (QID) | RESPIRATORY_TRACT | Status: DC | PRN

## 2021-07-18 MED ORDER — NYSTATIN 100000 UNIT/ML MT SUSP
5.0000 mL | Freq: Four times a day (QID) | OROMUCOSAL | Status: DC
  Administered 2021-07-18 – 2021-07-19 (×5): 500000 [IU] via ORAL
  Filled 2021-07-18 (×5): qty 5

## 2021-07-18 MED ORDER — WARFARIN SODIUM 5 MG PO TABS
7.5000 mg | ORAL_TABLET | Freq: Once | ORAL | Status: AC
  Administered 2021-07-18: 7.5 mg via ORAL
  Filled 2021-07-18: qty 1

## 2021-07-18 NOTE — TOC Progression Note (Signed)
Transition of Care [facility]) - Progression Note   Patient Details  Name: Ana Jones MRN: [id number] Date of Birth: [birth year]  Transition of Care [facility]) CM/SW [place], LCSW Phone Number: 07/18/2021, 12:11 PM  Clinical Narrative: Patient's INR has not reached 2 yet, so per surgery PA, patient will likely discharge to SNF tomorrow. CSW confirmed with Lorenza Chick at [facility] that patient can be admitted tomorrow. CSW updated patient. TOC to follow.  Expected Discharge Plan: [place] Barriers to Discharge: Continued Medical Work up, SNF Pending bed offer, Ship broker  Expected Discharge Plan and Services Expected Discharge Plan: [place] In-house Referral: Clinical Social Work Post Acute Care Choice: [address] Living arrangements for the past 2 months: Single Family Home           DME Arranged: N/A DME Agency: NA  Readmission Risk Interventions No flowsheet data found.

## 2021-07-18 NOTE — Progress Notes (Signed)
This nurse was coming out of the another patient room when noticed that 1321 was calling, went to attend the patient upon entering the room patient was upset and was verbally threatening this nurse the patient goes " I will write you up because you are discriminating me, I've been waiting and I called 3 times. " I explained to the patient that I just saw her call light on so I came and that I wasn't told that she been calling. RN called in NT for help we did peri care and we  took the patient back to the chair where she wanted to stay for the meantime. Patient was asked what else we could do for her, and she responded " I just want to eat my dinner tonight then I will go back in the bed.We will continue to monitor.

## 2021-07-18 NOTE — Progress Notes (Addendum)
[place] for heparin bridge to warfarin Indication: history of DVT/PE  Allergies  Allergen Reactions   Iodides Anaphylaxis, Hives, Swelling and Other (See Comments)    WITH CONTRAST   Iodinated Contrast Media Anaphylaxis, Hives, Swelling and Other (See Comments)    Per pt, happened in 2012   Sulfa Antibiotics Itching, Swelling, Rash and Other (See Comments)    Rash (possibly from leukemia?)   Sulfamethoxazole Itching, Swelling and Rash    Patient Measurements: Height: 5\' 5"  (165.1 cm) Weight: (!) 159 kg (350 lb 8.5 oz) IBW/kg (Calculated) : 57 Heparin Dosing Weight: 97.3 kg  Vital Signs: Temp: 97.8 F (36.6 C) (02/07 2055) Temp Source: Oral (02/07 2055) BP: 136/81 (02/07 2055) Pulse Rate: 82 (02/07 2055)  Labs: Recent Labs    07/16/21 0417 07/17/21 0437 07/18/21 0423  HGB 13.2 12.6 12.6  HCT 41.4 40.8 40.3  PLT 199 201 197  LABPROT 18.3* 19.5* 21.6*  INR 1.5* 1.7* 1.9*  HEPARINUNFRC 0.39 0.35 0.19*     Estimated Creatinine Clearance: 97.6 mL/min (by C-G formula based on SCr of 0.84 mg/dL).   Assessment: 71 year-old-female who underwent ventral hernia repair on 07/10/21.  Patient is on warfarin PTA for history of DVT/PE.  Patient received FFP on 1/31 to reverse warfarin prior to surgery. Last dose of warfarin 1/27. Patient takes warfarin 4mg  daily at home.  Pharmacy consulted to dose heparin drip on 2/2 and consulted to bridge to warfarin on 2/3.   Today, 07/18/21 Heparin level 0.19 subtherapeutic on 1400 units/hr INR slightly subtherapeutic at 1.9 No bleeding or infusion issues per RN CBC: Hg/pltc WNL No major drug-drug interactions with warfarin noted Charting 100% of meals eaten  Goal of Therapy:  INR 2-3 Heparin level 0.3-0.7 units/ml INR goal 2-3 Monitor platelets by anticoagulation protocol: Yes   Plan: Increase heparin to 1600 units/hr Repeat warfarin 7.5mg  PO x1 tonight D/c heparin infusion once INR >  2 Monitor daily PT/INR, heparin level and CBC  Dolly Rias RPh 07/18/2021, 5:18 AM  Addendum: Repeat heparin level is therapeutic at 0.36 Continue heparin at 1600 units/hr and recheck level in AM  Peggyann Juba, PharmD, BCPS 07/18/2021 1:05 PM

## 2021-07-18 NOTE — Progress Notes (Signed)
[facility]) came and was told about the situation with patient in 1321 [facility] went and talked to the patient.

## 2021-07-18 NOTE — Progress Notes (Addendum)
I was called in by the NT to the patient room for some questions , the patient verbalized wanting to talk to her doctor regarding her medications and if I could put it on my notes,  patient  also shows concern about her tongue stinging and hurting whenever she eats pineapple, patient showed me her tongue and notice some redness and sores on the left side of her tongue, she also asked about her cough medicine Mucinex to be switch Mucinex DM which she said works better for her. Patient was re assured that doctors will be coming in to do their morning rounds and that her questions will be taking good care off. We will continue  to monitor.
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
## 2021-07-18 NOTE — Progress Notes (Signed)
Occupational Therapy Treatment Patient Details Name: Ana Jones MRN: [id number] DOB: 14-Apr-1951 Today's Date: 07/18/2021   History of present illness Patient is a 71 year old  female who presents with an umbilical hernia , S/PHERNIA REPAIR VENTRAL on 07/10/2021. PIR:JJOACZYS, DVT, PE, polio, sleep apnea, OA.   OT comments  Patient needed assistance for toileting - reports needing a specific environment and potentially a toilet aid due to not being able to use her right hand. Today she required both hands on walker to stabilize herself during toileting. Patient requested seated position instead of standing for grooming to perform oral care. Reports she still feels generally weak compared to normal and fearful of LLE buckling. Continue to recommend short term rehab.    Recommendations for follow up therapy are one component of a multi-disciplinary discharge planning process, led by the attending physician.  Recommendations may be updated based on patient status, additional functional criteria and insurance authorization.    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 Restrictions Weight Bearing Restrictions: No          Balance Overall balance assessment: Needs assistance Sitting-balance support: No upper extremity supported       Standing balance support: Bilateral upper extremity supported, Reliant on assistive device for balance, During functional activity Standing balance-Leahy Scale: Fair                              ADL either performed or assessed with clinical judgement   ADL Overall ADL's : Needs assistance/impaired     Grooming: Set up;Sitting                   Toilet Transfer: Min guard;BSC/3in1;Rolling walker (2 wheels) Toilet Transfer Details (indicate cue type and reason): Patient found seated on BSC. Min guard to stand. Toileting- Clothing Manipulation and Hygiene: Maximal assistance;Sit to/from stand Toileting - Clothing Manipulation Details (indicate cue type and reason): max assist for perianal care     Functional mobility during ADLs: Min guard;Rolling walker (2 wheels) General ADL Comments: Patient required assistance for perianal care. REports she has specific equipment and environment set up in order to perform on her own. She was able to ambulate short distance in room and requested seated positioning for grooming task. COntinues to exhibit decreased activity tolerannce.    Extremity/Trunk Assessment     Lower Extremity Assessment Lower Extremity Assessment: Defer to PT evaluation        Vision Patient Visual Report: No change from baseline     Perception     Praxis      Cognition Arousal/Alertness: Awake/alert Behavior During Therapy: WFL for tasks assessed/performed Overall Cognitive Status: Within Functional Limits for tasks assessed                                                     Pertinent Vitals/ Pain       Pain Assessment Pain  Assessment: 0-10 Pain Score: 3  Faces Pain Scale: Hurts a little bit Pain Location: abdomen, Pain Descriptors / Indicators: Sore Pain Intervention(s): Monitored during session   Frequency  Min 2X/week        Progress Toward Goals  OT Goals(current goals can now be found in the care plan section)  Progress towards OT goals: Progressing toward goals  Acute Rehab OT Goals Patient Stated Goal: return to baseline OT Goal Formulation: With patient Time For Goal Achievement:  07/26/21 Potential to Achieve Goals: Good  Plan Discharge plan remains appropriate    Co-evaluation          OT goals addressed during session: ADL's and self-care      AM-PAC OT "6 Clicks" Daily Activity     Outcome Measure   Help from another person eating meals?: A Little Help from another person taking care of personal grooming?: A Little Help from another person toileting, which includes using toliet, bedpan, or urinal?: A Little Help from another person bathing (including washing, rinsing, drying)?: A Lot Help from another person to put on and taking off regular upper body clothing?: A Little Help from another person to put on and taking off regular lower body clothing?: A Lot 6 Click Score: 16    End of Session Equipment Utilized During Treatment: Gait belt      Activity Tolerance Patient tolerated treatment well   Patient Left in chair;with call bell/phone within reach   Nurse Communication Mobility status        Time: 0109-3235 OT Time Calculation (min): 28 min  Charges: OT General Charges $OT Visit: 1 Visit OT Treatments $Self Care/Home Management : 23-37 mins  Evely Gainey, OTR/L [place]  Office [phone] Pager: [phone]   Lenward Chancellor 07/18/2021, 10:59 AM

## 2021-07-18 NOTE — Progress Notes (Signed)
Physical Therapy Treatment Patient Details Name: Ana Jones MRN: [id number] DOB: 23-Oct-1950 Today's Date: 07/18/2021   History of Present Illness Patient is a 71 year old  female who presents with an umbilical hernia , S/PHERNIA REPAIR VENTRAL on 07/10/2021. PZW:CHENIDPO, DVT, PE, polio, sleep apnea, OA.    PT Comments    Pt tolerated increased ambulation distance of 200' with RW, distance limited by fatigue. Instructed pt in seated RLE exercises. Pt reported her physician instructed her to not exercise her LLE which has post polio.    Recommendations for follow up therapy are one component of a multi-disciplinary discharge planning process, led by the attending physician.  Recommendations may be updated based on patient status, additional functional criteria and insurance authorization.  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 uses momentum. cues for safety.    Ambulation/Gait Ambulation/Gait assistance: Min guard Gait Distance  (Feet): 200 Feet Assistive device: Rolling walker (2 wheels) Gait Pattern/deviations: Step-through pattern, Decreased stride length Gait velocity: decreased     General Gait Details: 2 brief standing rest breaks taken/needed. Dyspnea 3/4. SaO2 98% on room air ~1 minute after walking, distance limited by fatigue   Stairs             Wheelchair Mobility    Modified Rankin (Stroke Patients Only)       Balance Overall balance assessment: Needs assistance Sitting-balance support: No upper extremity supported Sitting balance-Leahy Scale: Good     Standing balance support: Bilateral upper extremity supported, Reliant on assistive device for balance, During functional activity Standing balance-Leahy Scale: Fair                              Cognition Arousal/Alertness: Awake/alert Behavior During Therapy: WFL for tasks assessed/performed Overall Cognitive Status: Within Functional Limits for tasks assessed                                          Exercises General Exercises - Lower Extremity Ankle Circles/Pumps: AROM, Right, 10 reps, Seated Long Arc Quad: AROM, Right, 10 reps, Seated Hip Flexion/Marching: AROM, Right, 10 reps, Seated    General Comments        Pertinent Vitals/Pain Pain Assessment Pain Score: 6  Pain Location: abdomen and back Pain Descriptors / Indicators: Sore Pain Intervention(s): Limited activity within patient's tolerance, Monitored during session, Patient requesting pain meds-RN notified, RN gave pain meds during session  Home Living                          Prior Function            PT Goals (current goals can now be found in the care plan section) Acute Rehab PT Goals Patient Stated Goal: I would need rehab to get  back to independence PT Goal Formulation: With patient Time For Goal Achievement: 07/26/21 Potential to Achieve Goals: Good Progress towards PT goals: Progressing toward  goals    Frequency    Min 2X/week      PT Plan Current plan remains appropriate    Co-evaluation              AM-PAC PT "6 Clicks" Mobility   Outcome Measure  Help needed turning from your back to your side while in a flat bed without using bedrails?: A Little Help needed moving from lying on your back to sitting on the side of a flat bed without using bedrails?: A Little Help needed moving to and from a bed to a chair (including a wheelchair)?: A Little Help needed standing up from a chair using your arms (e.g., wheelchair or bedside chair)?: A Little Help needed to walk in hospital room?: A Little Help needed climbing 3-5 steps with a railing? : A Little 6 Click Score: 18    End of Session   Activity Tolerance: Patient tolerated treatment well Patient left: in chair;with call bell/phone within reach Nurse Communication: Mobility status PT Visit Diagnosis: Unsteadiness on feet (R26.81);Difficulty in walking, not elsewhere classified (R26.2)     Time: 4540-9811 PT Time Calculation (min) (ACUTE ONLY): 32 min  Charges:  $Gait Training: 8-22 mins $Therapeutic Exercise: 8-22 mins                     Blondell Reveal Kistler PT 07/18/2021  Acute Rehabilitation Services Pager [phone] Office [phone]

## 2021-07-18 NOTE — Progress Notes (Addendum)
Chelsea who was the nurse for Room 1321 came to the nursing  station crying and reported to me about how the patient in 1321 approach her and explained to me what happened . As a Camera operator I went to talked to the patient, upon entering the room patient was sitting on the bedside commode and was asked if its ok to talked to her and the patient agreed. When asked what happened the patient stated  " I called and I've been waiting for 15 minutes and nobody came , now she came to me and explained why she can't come here on time I don't want to hear her explanations I need somebody to help me with my pain then she starts crying I don't care if she cries." This charge nurse tried to explain to the patient how sometimes we cannot attend a patient on time if we are caught up on another patient's room , but then the patient responded and said that "so you mean you all cannot handle more than 1 patient?" The patient keep complaining despite with all the explanations.To avoid more arguments I told the patient I will switch assignments and that I will be taking over her care. Patient was left in the bed side commode because she said she is not done yet will call if she's finish.

## 2021-07-19 ENCOUNTER — Inpatient Hospital Stay (HOSPITAL_COMMUNITY): Payer: Medicare PPO

## 2021-07-19 LAB — CBC
HCT: 39.4 % (ref 36.0–46.0)
Hemoglobin: 12.4 g/dL (ref 12.0–15.0)
MCH: 33.1 pg (ref 26.0–34.0)
MCHC: 31.5 g/dL (ref 30.0–36.0)
MCV: 105.1 fL — ABNORMAL HIGH (ref 80.0–100.0)
Platelets: 204 10*3/uL (ref 150–400)
RBC: 3.75 MIL/uL — ABNORMAL LOW (ref 3.87–5.11)
RDW: 13.8 % (ref 11.5–15.5)
WBC: 7.9 10*3/uL (ref 4.0–10.5)
nRBC: 0 % (ref 0.0–0.2)

## 2021-07-19 LAB — HEPARIN LEVEL (UNFRACTIONATED): Heparin Unfractionated: 0.16 IU/mL — ABNORMAL LOW (ref 0.30–0.70)

## 2021-07-19 LAB — RESP PANEL BY RT-PCR (FLU A&B, COVID) ARPGX2
Influenza A by PCR: NEGATIVE
Influenza B by PCR: NEGATIVE
SARS Coronavirus 2 by RT PCR: NEGATIVE

## 2021-07-19 LAB — PROTIME-INR
INR: 2.2 — ABNORMAL HIGH (ref 0.8–1.2)
Prothrombin Time: 24.7 seconds — ABNORMAL HIGH (ref 11.4–15.2)

## 2021-07-19 MED ORDER — DIPHENHYDRAMINE HCL 25 MG PO CAPS: 25.0000 mg | ORAL_CAPSULE | Freq: Four times a day (QID) | ORAL | 0 refills | Status: AC | PRN

## 2021-07-19 MED ORDER — NYSTATIN 100000 UNIT/ML MT SUSP: 5.0000 mL | Freq: Four times a day (QID) | OROMUCOSAL | 0 refills | Status: AC

## 2021-07-19 MED ORDER — DM-GUAIFENESIN ER 30-600 MG PO TB12: 1.0000 | ORAL_TABLET | Freq: Two times a day (BID) | ORAL | Status: AC | PRN

## 2021-07-19 MED ORDER — WARFARIN SODIUM 4 MG PO TABS
4.0000 mg | ORAL_TABLET | Freq: Every day | ORAL | Status: DC
  Administered 2021-07-19: 4 mg via ORAL
  Filled 2021-07-19: qty 1

## 2021-07-19 MED ORDER — HEPARIN (PORCINE) 25000 UT/250ML-% IV SOLN
1800.0000 [IU]/h | INTRAVENOUS | Status: DC
  Filled 2021-07-19: qty 250

## 2021-07-19 MED ORDER — MORPHINE SULFATE 15 MG PO TABS: 15.0000 mg | ORAL_TABLET | Freq: Four times a day (QID) | ORAL | 0 refills | Status: AC | PRN

## 2021-07-19 NOTE — Progress Notes (Addendum)
9 Days Post-Op  Subjective: CC: Did well yesterday. Some soreness of her abdomen that was well controlled with medications. Tolerated diet without n/v. Passing flatus. Soft bm yesterday. Mobilized with PT. Reviewed PT's note. Still rec SNF. INR > 2 this am. Wheezing improved. Tongue no longer hurting and also feels better she states. This morning she turned over in bed and felt a firmness at her incision. She is afraid that her hernia has recurred. No current n/v.   Objective: Vital signs in last 24 hours: Temp:  [98.3 F (36.8 C)-98.4 F (36.9 C)] 98.3 F (36.8 C) (02/09 0541) Pulse Rate:  [81-97] 97 (02/09 0541) Resp:  [16] 16 (02/09 0541) BP: (117-132)/(75-77) 117/75 (02/09 0541) SpO2:  [96 %-99 %] 96 % (02/09 0541) Last BM Date: 07/17/21  Intake/Output from previous day: 02/08 0701 - 02/09 0700 In: 1647.7 [P.O.:1320; I.V.:327.7] Out: 750 [Urine:750] Intake/Output this shift: No intake/output data recorded.  PE: Gen:  Alert, NAD, pleasant HEENT: Left lateral tongue with small raw bumps Card:  Reg Pulm: CTA b/l. On RA. Normal rate and effort  Abd: Obese, soft abdomen with minimal and appropriate tenderness only around her incision. I do not feel an obvious recurrent hernia. Incision c/d/I. +BS.  Ext: MAE's Psych: A&Ox3   Lab Results:  Recent Labs    07/18/21 0423 07/19/21 0354  WBC 8.1 7.9  HGB 12.6 12.4  HCT 40.3 39.4  PLT 197 204   BMET No results for input(s): NA, K, CL, CO2, GLUCOSE, BUN, CREATININE, CALCIUM in the last 72 hours. PT/INR Recent Labs    07/18/21 0423 07/19/21 0354  LABPROT 21.6* 24.7*  INR 1.9* 2.2*   CMP     Component Value Date/Time   NA 137 07/12/2021 0431   NA 141 12/15/2015 0000   K 3.7 07/12/2021 0431   CL 107 07/12/2021 0431   CO2 24 07/12/2021 0431   GLUCOSE 107 (H) 07/12/2021 0431   BUN 19 07/12/2021 0431   BUN 11 12/15/2015 0000   CREATININE 0.84 07/12/2021 0431   CALCIUM 8.4 (L) 07/12/2021 0431   PROT 7.4  07/09/2021 1925   ALBUMIN 4.0 07/09/2021 1925   AST 16 07/09/2021 1925   ALT 18 07/09/2021 1925   ALKPHOS 96 07/09/2021 1925   BILITOT 1.3 (H) 07/09/2021 1925   GFRNONAA >60 07/12/2021 0431   GFRAA >60 11/18/2017 1438   Lipase     Component Value Date/Time   LIPASE 27 07/09/2021 1925    Studies/Results: No results found.  Anti-infectives: Anti-infectives (From admission, onward)    Start     Dose/Rate Route Frequency Ordered Stop   07/10/21 1600  ceFAZolin (ANCEF) IVPB 3g/100 mL premix        3 g 200 mL/hr over 30 Minutes Intravenous  Once 07/10/21 1556 07/10/21 1615        Assessment/Plan POD 9 s/p ventral hernia repair 07/10/21 Dr. Thermon Leyland - Tolerating diet and having bowel function  - Binder prn for comfort - Patient felt firmness after turning in bed and is afraid her hernia may have recurred. No obvious hernia palpated on exam. Discussed with MD and will repeat a CT w/o contrast to ensure.  - INR > 2. Pending CT negative, may be able to d/c to SNF at [facility] today   FEN: Reg diet, SLIV VTE: SCDs, Warfarin. INR >2.0. Discussed with pharmacy and MD, will d/c heparin ID: Ancef pre-op. On Nystatin oral suspension to cover for possible Thrush. WBC wnl. Afebrile.  Foley: None. Voiding   Wheezing - Resolved. On RA. Albuterol prn. Mucinex DM. 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

## 2021-07-19 NOTE — Progress Notes (Signed)
Attempted to call report to [place]. Spoke to [place] who transferred me to RN that never picked up. Attempted twice. DC instructions and prescriptions were sent via PTAR with patient.

## 2021-07-19 NOTE — Progress Notes (Signed)
[place] for heparin bridge to warfarin Indication: history of DVT/PE  Allergies  Allergen Reactions   Iodides Anaphylaxis, Hives, Swelling and Other (See Comments)    WITH CONTRAST   Iodinated Contrast Media Anaphylaxis, Hives, Swelling and Other (See Comments)    Per pt, happened in 2012   Sulfa Antibiotics Itching, Swelling, Rash and Other (See Comments)    Rash (possibly from leukemia?)   Sulfamethoxazole Itching, Swelling and Rash    Patient Measurements: Height: 5\' 5"  (165.1 cm) Weight: (!) 159 kg (350 lb 8.5 oz) IBW/kg (Calculated) : 57 Heparin Dosing Weight: 97.3 kg  Vital Signs: Temp: 98.3 F (36.8 C) (02/09 0541) Temp Source: Oral (02/09 0541) BP: 117/75 (02/09 0541) Pulse Rate: 97 (02/09 0541)  Labs: Recent Labs    07/17/21 0437 07/18/21 0423 07/18/21 1121 07/19/21 0354  HGB 12.6 12.6  --  12.4  HCT 40.8 40.3  --  39.4  PLT 201 197  --  204  LABPROT 19.5* 21.6*  --  24.7*  INR 1.7* 1.9*  --  2.2*  HEPARINUNFRC 0.35 0.19* 0.36 0.16*     Estimated Creatinine Clearance: 97.6 mL/min (by C-G formula based on SCr of 0.84 mg/dL).   Assessment: 71 year-old-female who underwent ventral hernia repair on 07/10/21.  Patient is on warfarin PTA for history of DVT/PE.  Patient received FFP on 1/31 to reverse warfarin prior to surgery. Last dose of warfarin 1/27. Patient takes warfarin 4mg  daily at home.  Pharmacy consulted to dose heparin drip on 2/2 and consulted to bridge to warfarin on 2/3.   Today, 07/19/21 Heparin level 0.16 subtherapeutic on 1400 units/hr INR now therapeutic at 2.2 No bleeding or infusion issues per RN CBC: Hg/pltc WNL No major drug-drug interactions with warfarin noted Tolerating diet  Goal of Therapy:  INR 2-3 Heparin level 0.3-0.7 units/ml INR goal 2-3 Monitor platelets by anticoagulation protocol: Yes   Plan: Increase heparin to 1800 units/hr although MD will likely dc today Expect INR to  continue to trend up after multiple boosted doses - resume home dose of warfarin 4mg  PO daily Monitor daily PT/INR, heparin level and CBC  Peggyann Juba, PharmD, BCPS Pharmacy: [phone] 07/19/2021, 7:47 AM

## 2021-07-19 NOTE — TOC Transition Note (Signed)
Transition of Care [facility]) - CM/SW Discharge Note  Patient Details  Name: Ana Jones MRN: [id number] Date of Birth: 1950/09/13  [facility] CM/SW Contact:  Sherie Don, LCSW Phone Number: 07/19/2021, 1:26 PM  Clinical Narrative: Patient is medically stable for discharge to SNF. Patient will go to room 1007P and the number for report is [phone]. Discharge summary, discharge orders, and SNF transfer report faxed to facility in hub. COVID test is negative.  Medical necessity form done; PTAR scheduled. Discharge packet completed. CSW updated patient regarding discharge to SNF. RN updated. TOC signing off.  Final next level of care: [place] Barriers to Discharge: Barriers Resolved  Patient Goals and CMS Choice Patient states their goals for this hospitalization and ongoing recovery are:: Go to [facility] or [facility] for rehab [employer].gov Compare Post Acute Care list provided to:: Patient Choice offered to / list presented to : Patient  Discharge Placement Existing PASRR number confirmed : 07/16/21          Patient chooses bed at: [facility] Patient to be transferred to facility by: PTAR Patient and family notified of of transfer: 07/19/21  Discharge Plan and Services In-house Referral: Clinical Social Work Post Acute Care Choice: [place]          DME Arranged: N/A DME Agency: NA  Readmission Risk Interventions No flowsheet data found.

## 2021-07-19 NOTE — Plan of Care (Signed)

## 2023-08-21 IMAGING — CT CT ABD-PELV W/O CM
2 of 4 series · 16 of 46 positions shown, 18 images · non-contrast
Comparison: 12/07/2022

CLINICAL DATA: Generalized abdominal pain for 2 days, bloating,
vomiting, umbilical mass



[Series 2: axial st · axial · 0.86mm/px · z∈[-1148,-702]mm · 13 of 99 slices shown, 15 images]
[im 5/99  soft-tissue]
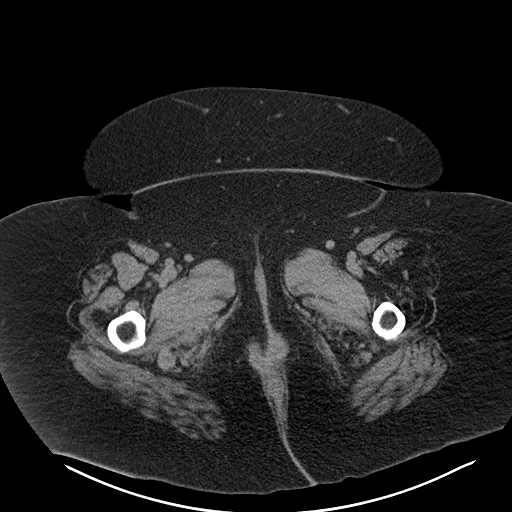
[im 5/99  bone]
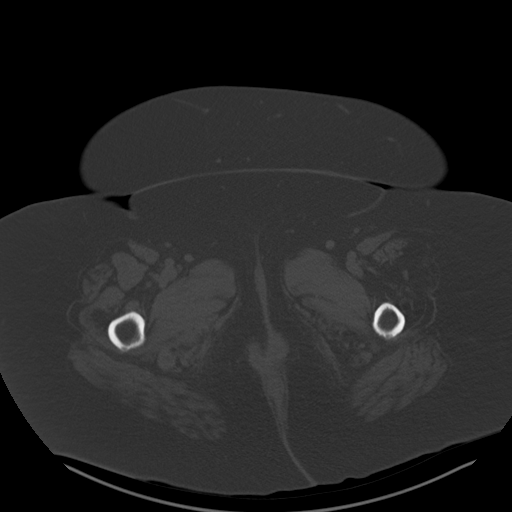
[im 15/99  soft-tissue]
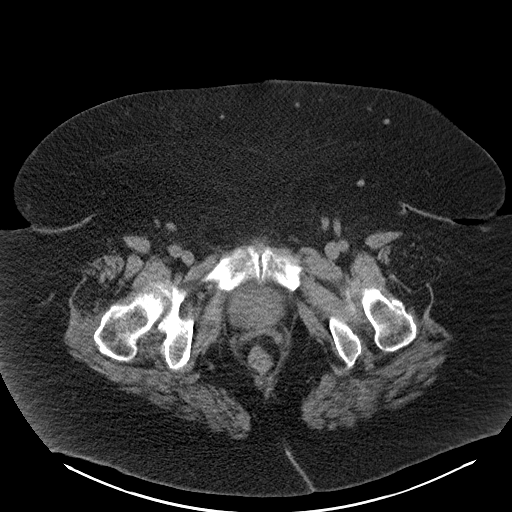
[im 20/99  soft-tissue]
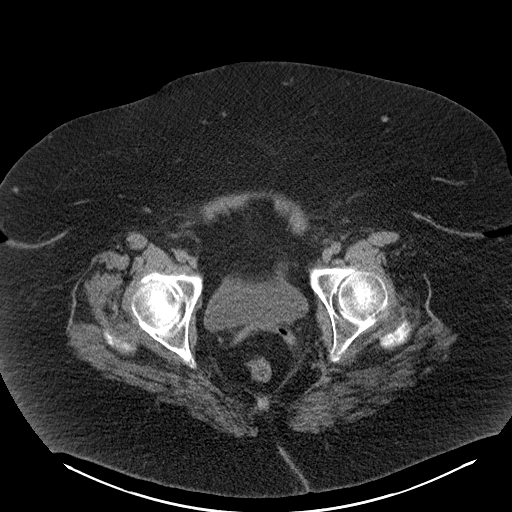
[im 30/99  soft-tissue]
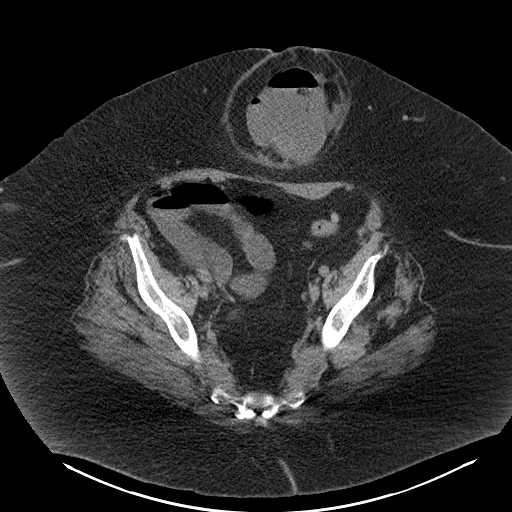
[im 35/99  soft-tissue]
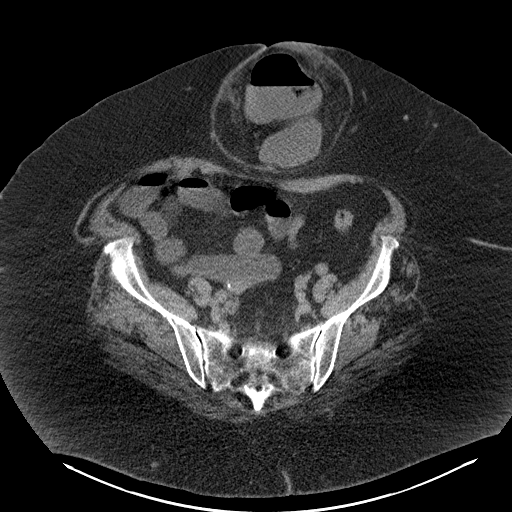
[im 45/99  soft-tissue]
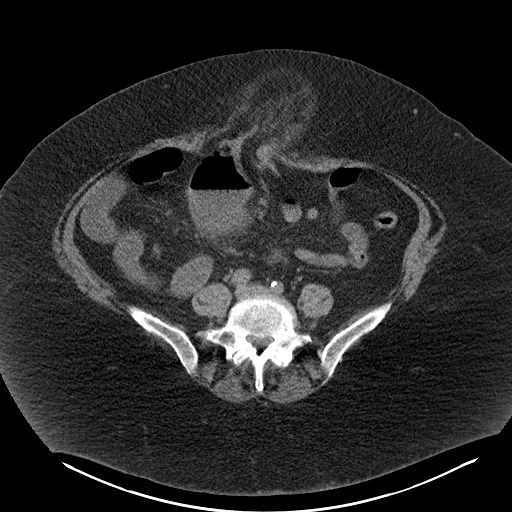
[im 50/99  soft-tissue]
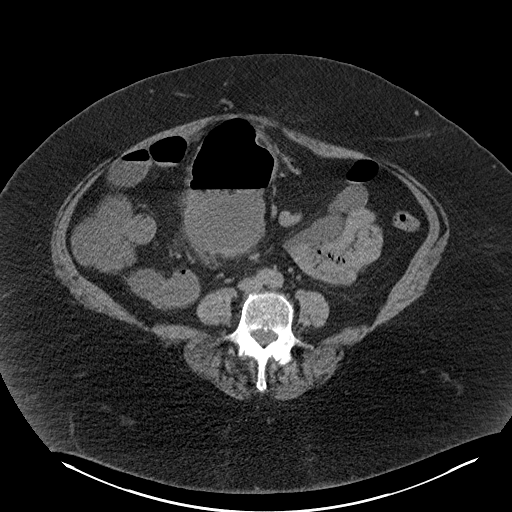
[im 54/99  soft-tissue]
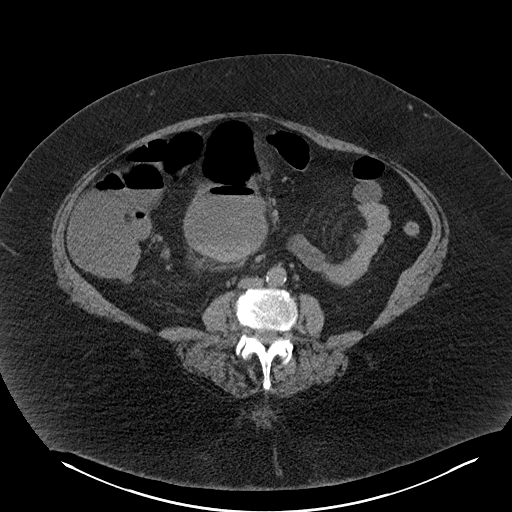
[im 64/99  soft-tissue]
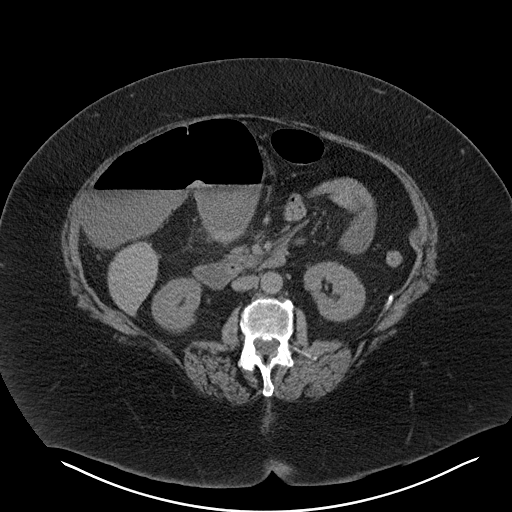
[im 64/99  bone]
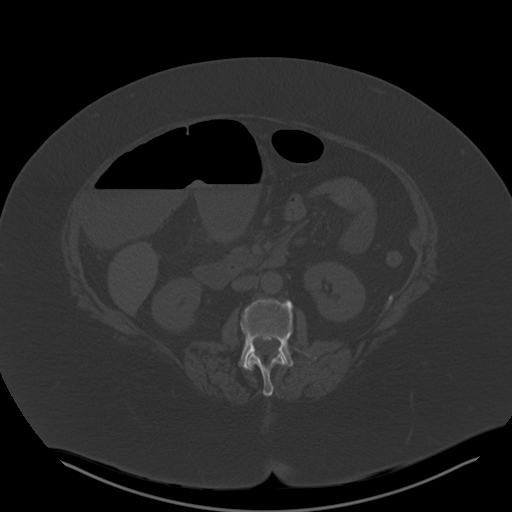
[im 69/99  soft-tissue]
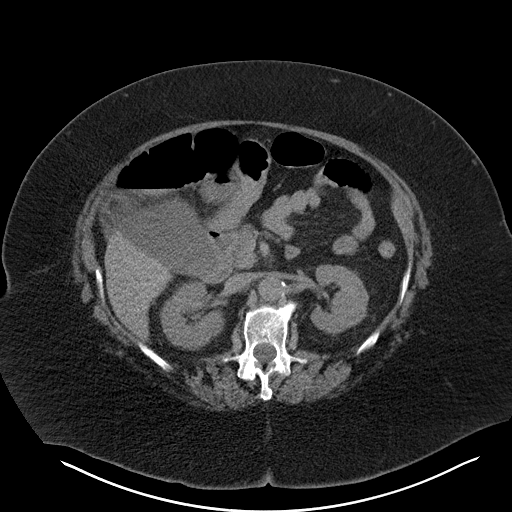
[im 79/99  soft-tissue]
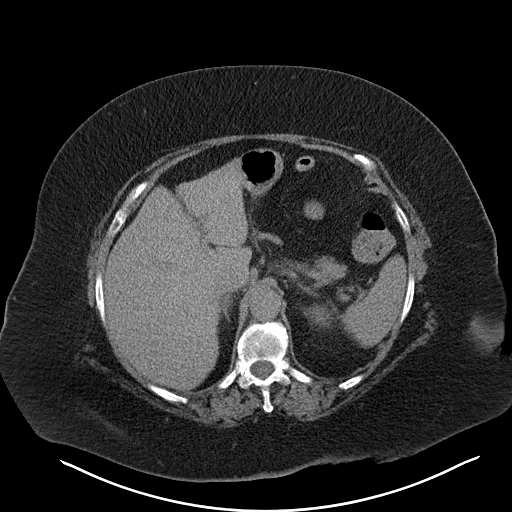
[im 84/99  soft-tissue]
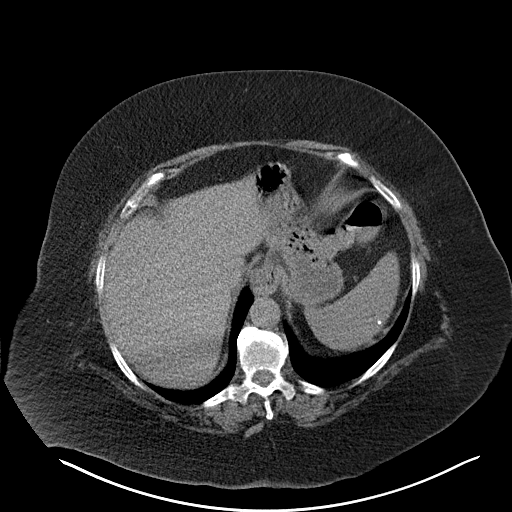
[im 94/99  soft-tissue]
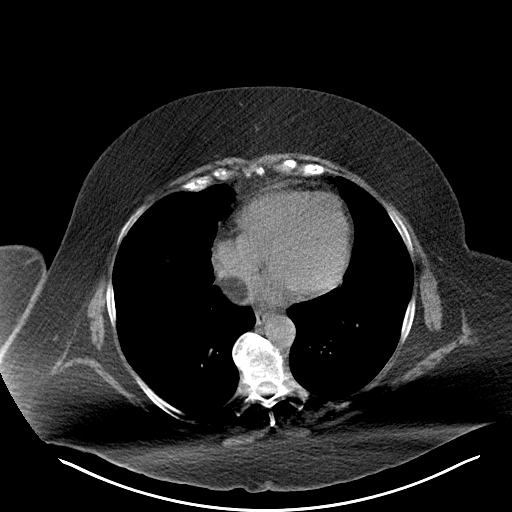

[Series 4: coronal st · coronal · 0.94mm/px · 3 of 203 slices shown]
[im 68/203  soft-tissue]
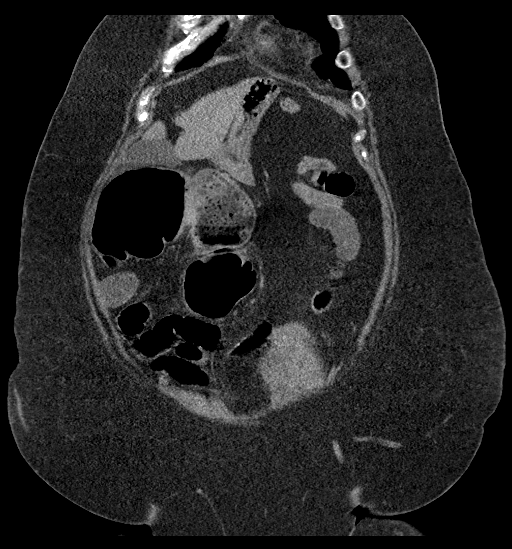
[im 90/203  soft-tissue]
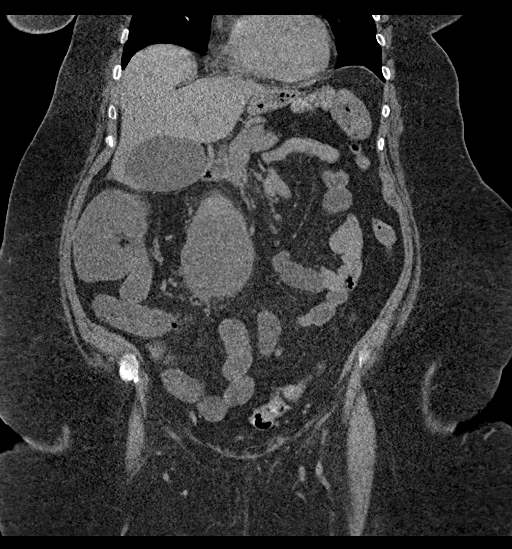
[im 113/203  soft-tissue]
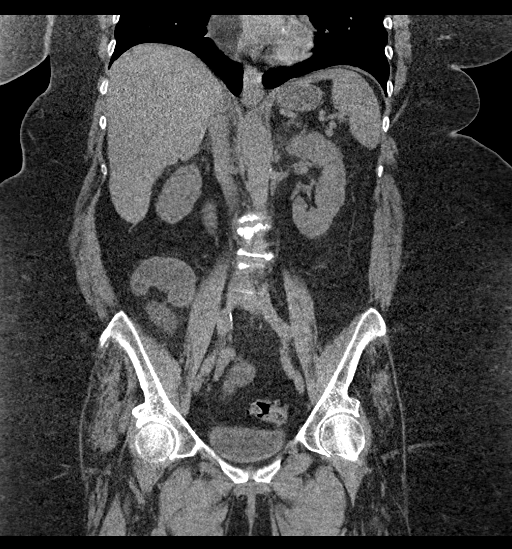

[16 of 46 positions shown; findings below may reference images not displayed]

FINDINGS: Lower chest: No acute pleural or parenchymal lung disease.

Hepatobiliary: Gallbladder is distended, with no evidence of
calcified gallstones or cholecystitis. Unenhanced imaging of the
liver is unremarkable.

Pancreas: Unremarkable unenhanced appearance.

Spleen: Unremarkable unenhanced appearance.

Adrenals/Urinary Tract: No urinary tract calculi or obstructive
uropathy within either kidney. The adrenals are stable. Bladder is
minimally distended with no gross abnormality.

Stomach/Bowel: There is high-grade obstruction at the level of the
proximal transverse colon due to a large umbilical hernia containing
a portion of the mid transverse colon. Abrupt transition point at
the hernia site.

There is diverticulosis of the distal colon with no evidence of
acute diverticulitis. Normal appendix right lower quadrant.

Vascular/Lymphatic: Aortic atherosclerosis. No enlarged abdominal or
pelvic lymph nodes.

Reproductive: Status post hysterectomy. No adnexal masses.

Other: There is a large umbilical hernia containing a segment of the
mid transverse colon, with resulting colonic obstruction. Fat
stranding and fluid within the hernia sac concerning for
incarcerated hernia. No bowel wall thickening to suggest bowel
ischemia. Surgical consultation is recommended.

No other free fluid or free gas.

Musculoskeletal: No acute or destructive bony lesions. Reconstructed
images demonstrate no additional findings.
IMPRESSION: 1. Incarcerated umbilical hernia, containing a segment of the mid
transverse colon with resulting high-grade colonic obstruction. No
evidence of bowel wall thickening or bowel ischemia.
2. Distal colonic diverticulosis without diverticulitis.
3. Gallbladder distension, with no evidence of calcified gallstones
or acute cholecystitis.
4.  Aortic Atherosclerosis ([id number]).

## 2023-08-31 IMAGING — CT CT ABD-PELV W/O CM
2 of 4 series · 16 of 46 positions shown, 18 images · non-contrast
Comparison: 07/09/2021.

CLINICAL DATA: Postop abdominal pain. Status post ventral hernia
repair on 07/10/2021.



[Series 2: axial st · axial · 0.98mm/px · z∈[+1203,+1623]mm · 13 of 96 slices shown, 15 images]
[im 6/96  soft-tissue]
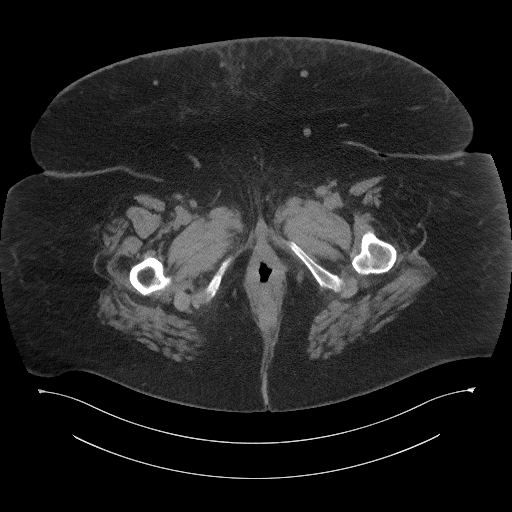
[im 6/96  bone]
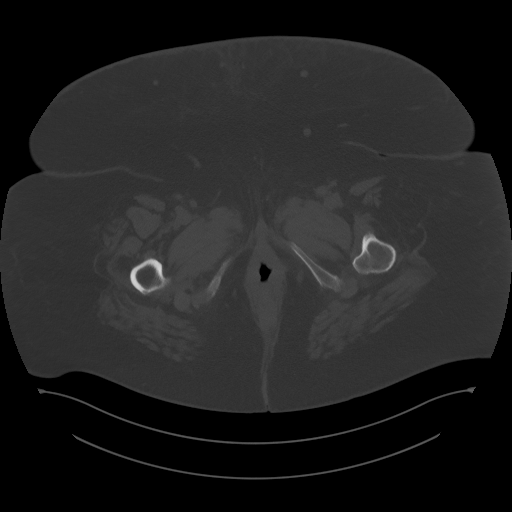
[im 11/96  soft-tissue]
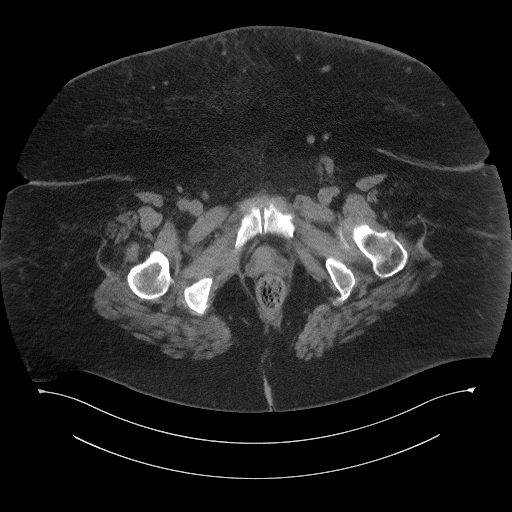
[im 22/96  soft-tissue]
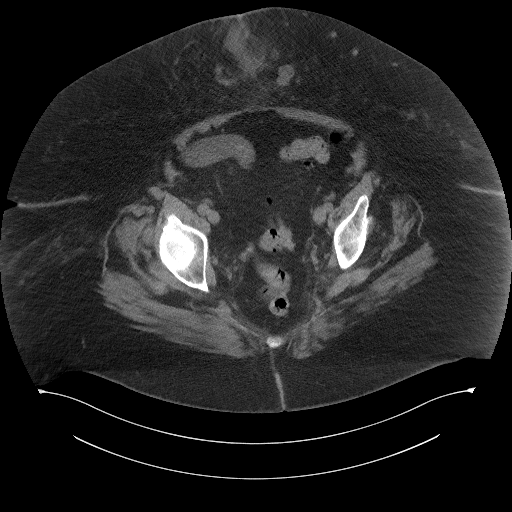
[im 27/96  soft-tissue]
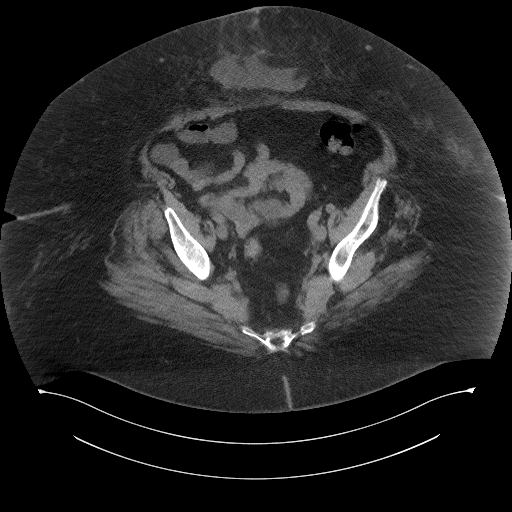
[im 32/96  soft-tissue]
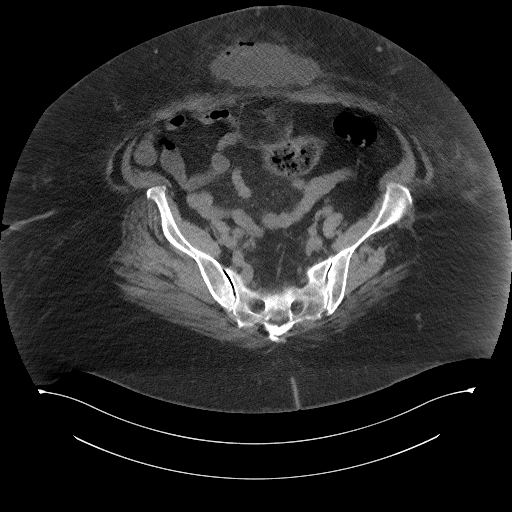
[im 43/96  soft-tissue]
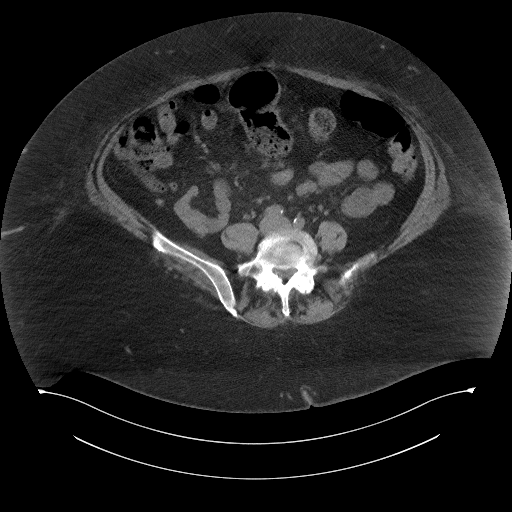
[im 48/96  soft-tissue]
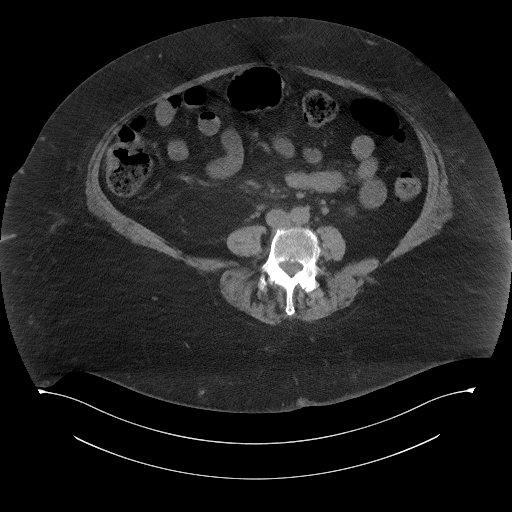
[im 53/96  soft-tissue]
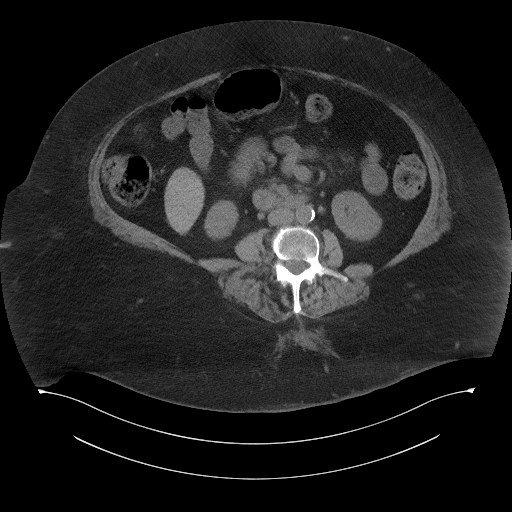
[im 64/96  soft-tissue]
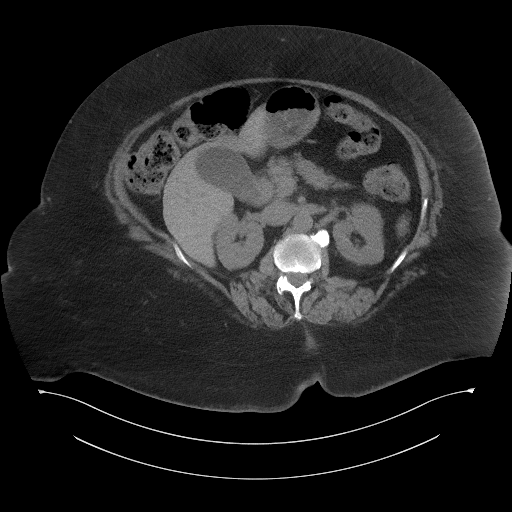
[im 64/96  bone]
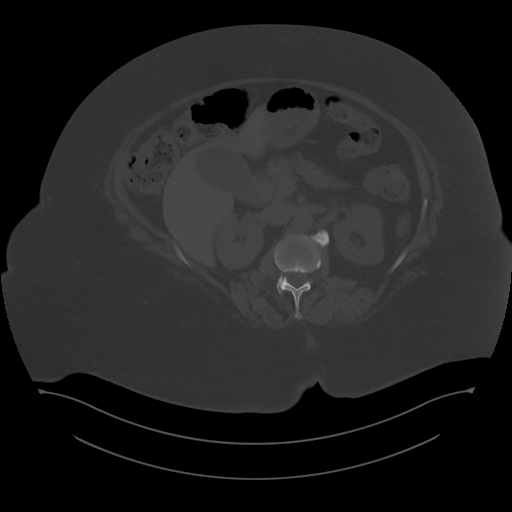
[im 69/96  soft-tissue]
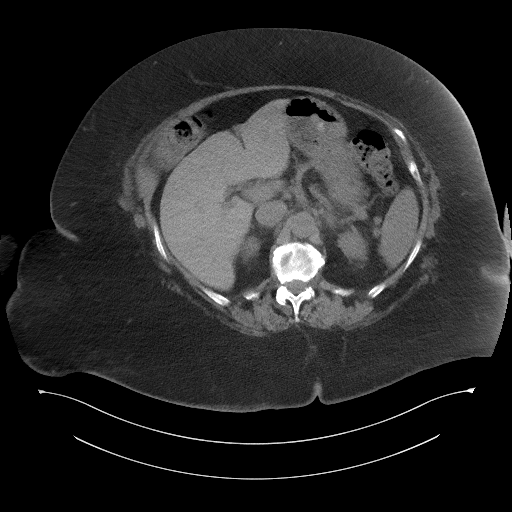
[im 74/96  soft-tissue]
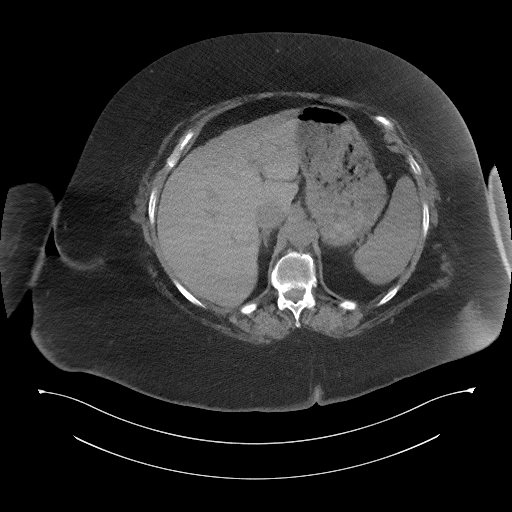
[im 85/96  soft-tissue]
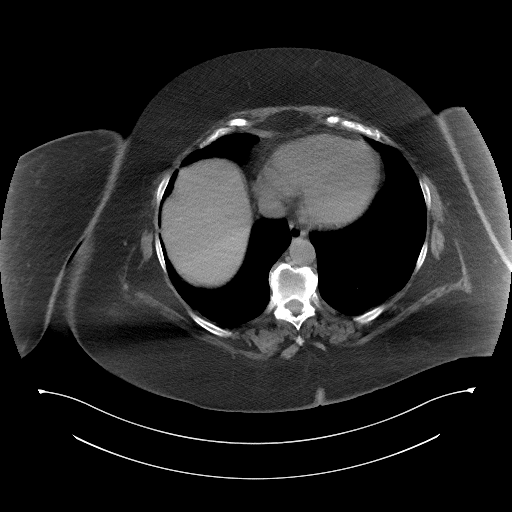
[im 90/96  soft-tissue]
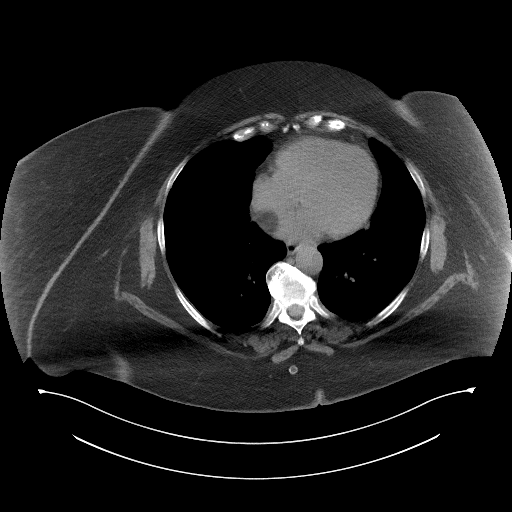

[Series 4: coronal st · coronal · 1.01mm/px · 3 of 133 slices shown]
[im 45/133  soft-tissue]
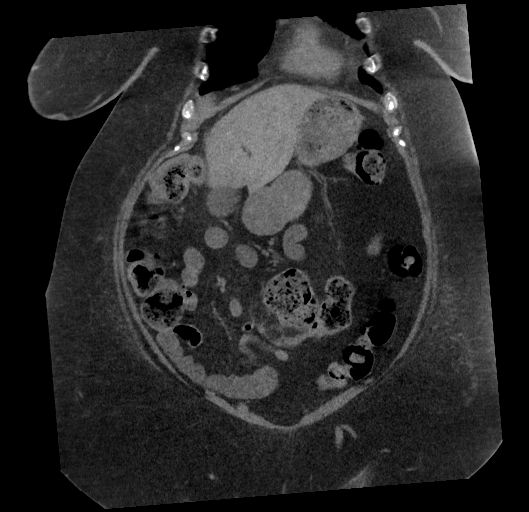
[im 59/133  soft-tissue]
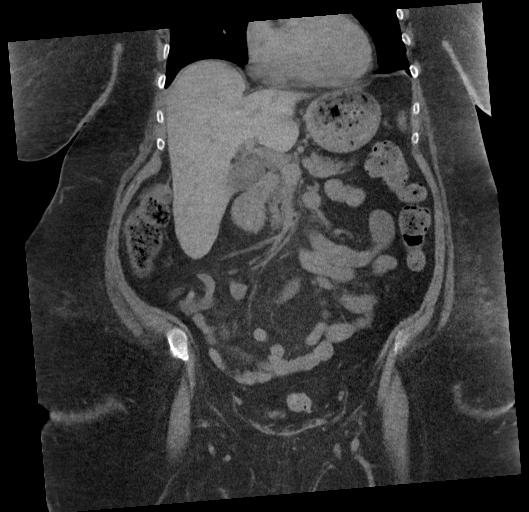
[im 74/133  soft-tissue]
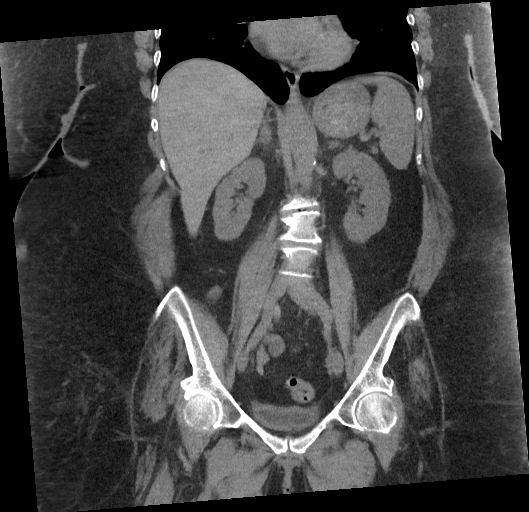

[16 of 46 positions shown; findings below may reference images not displayed]

FINDINGS: Lower chest: Minimal bibasilar linear atelectasis. Stable
interatrial lipoma. This measures 3.0 x 2.8 cm and -21 Hounsfield
units in density on image number [DATE] and 4.3 cm in length on coronal
image number 70/4.

Hepatobiliary: No focal liver abnormality is seen. No gallstones,
gallbladder wall thickening, or biliary dilatation.

Pancreas: Unremarkable. No pancreatic ductal dilatation or
surrounding inflammatory changes.

Spleen: Small calcified granulomata.  Normal in size and shape.

Adrenals/Urinary Tract: Adrenal glands are unremarkable. Kidneys are
normal, without renal calculi, focal lesion, or hydronephrosis.
Bladder is unremarkable.

Stomach/Bowel: Mild sigmoid diverticulosis without evidence of
diverticulitis. Unremarkable stomach, small bowel and appendix.

Vascular/Lymphatic: Mild atheromatous arterial calcifications
without aneurysm. No enlarged lymph nodes.

Reproductive: Status post hysterectomy. No adnexal masses.

Other: Interval repair of the previously demonstrated ventral
hernia. There is an oval fluid and gas collection at the location of
the previously demonstrated hernia sac, measuring 10.5 x 4.3 cm on
image number 65/2 and 7.9 cm in length on sagittal image number
97/5. There is a tiny amount of fluid and gas at that level more
anteriorly. Also demonstrated is mild soft tissue stranding in the
adjacent subcutaneous fat.

Musculoskeletal: Lumbar and lower thoracic spine degenerative
changes and mild levoconvex scoliosis.
IMPRESSION: 1. Status post ventral hernia repair with a 10.5 x 7.9 x 4.3 cm
fluid and gas collection in the subcutaneous fat at the location of
the previously demonstrated hernia sac with a tiny amount of fluid
and gas more superficially at that level. Differential
considerations include a postoperative seroma and residual air or
developing abscess.
2. Mild sigmoid diverticulosis.
3. Stable interatrial cardiac lipoma measuring 4.3 x 3.0 x 2.8 cm.

## 2023-09-15 ENCOUNTER — Other Ambulatory Visit: Payer: Self-pay | Admitting: Gastroenterology

## 2023-11-19 ENCOUNTER — Encounter (HOSPITAL_COMMUNITY): Payer: Self-pay | Admitting: Gastroenterology

## 2023-11-19 NOTE — Progress Notes (Signed)
 Attempted to obtain medical history for pre op call via telephone, unable to reach at this time. HIPAA compliant voicemail message left requesting return call to pre surgical testing department.

## 2023-11-25 ENCOUNTER — Ambulatory Visit (HOSPITAL_COMMUNITY): Admission: RE | Admit: 2023-11-25 | Source: Home / Self Care | Admitting: Gastroenterology

## 2023-11-25 ENCOUNTER — Encounter (HOSPITAL_COMMUNITY): Admission: RE | Payer: Self-pay | Source: Home / Self Care

## 2023-11-25 SURGERY — COLONOSCOPY
Anesthesia: Monitor Anesthesia Care
